# Patient Record
Sex: Male | Born: 1950 | Race: White | Hispanic: No | Marital: Married | Smoking: Never smoker
Health system: Southern US, Community
[De-identification: ages and names within clinical notes are randomized; demographics above are authoritative.]

## PROBLEM LIST (undated history)

## (undated) DIAGNOSIS — F419 Anxiety disorder, unspecified: Secondary | ICD-10-CM

## (undated) DIAGNOSIS — R972 Elevated prostate specific antigen [PSA]: Secondary | ICD-10-CM

## (undated) DIAGNOSIS — Z85828 Personal history of other malignant neoplasm of skin: Secondary | ICD-10-CM

## (undated) DIAGNOSIS — R002 Palpitations: Secondary | ICD-10-CM

## (undated) DIAGNOSIS — G4700 Insomnia, unspecified: Secondary | ICD-10-CM

## (undated) HISTORY — PX: TONSILLECTOMY: SUR1361

## (undated) HISTORY — PX: PROSTATE BIOPSY: SHX241

## (undated) HISTORY — DX: Insomnia, unspecified: G47.00

## (undated) HISTORY — DX: Palpitations: R00.2

## (undated) HISTORY — DX: Anxiety disorder, unspecified: F41.9

## (undated) HISTORY — DX: Elevated prostate specific antigen (PSA): R97.20

## (undated) HISTORY — DX: Personal history of other malignant neoplasm of skin: Z85.828

## (undated) HISTORY — PX: SKIN CANCER EXCISION: SHX779

---

## 1989-02-08 DIAGNOSIS — C4491 Basal cell carcinoma of skin, unspecified: Secondary | ICD-10-CM

## 1989-02-08 HISTORY — DX: Basal cell carcinoma of skin, unspecified: C44.91

## 1996-12-18 DIAGNOSIS — D229 Melanocytic nevi, unspecified: Secondary | ICD-10-CM

## 1996-12-18 HISTORY — DX: Melanocytic nevi, unspecified: D22.9

## 2005-08-10 DIAGNOSIS — C4491 Basal cell carcinoma of skin, unspecified: Secondary | ICD-10-CM

## 2005-08-10 HISTORY — DX: Basal cell carcinoma of skin, unspecified: C44.91

## 2010-02-21 DIAGNOSIS — C4492 Squamous cell carcinoma of skin, unspecified: Secondary | ICD-10-CM

## 2010-02-21 HISTORY — DX: Squamous cell carcinoma of skin, unspecified: C44.92

## 2015-02-04 ENCOUNTER — Other Ambulatory Visit (HOSPITAL_COMMUNITY): Payer: Self-pay | Admitting: Family Medicine

## 2015-02-04 DIAGNOSIS — R002 Palpitations: Secondary | ICD-10-CM

## 2015-02-11 ENCOUNTER — Other Ambulatory Visit: Payer: Self-pay

## 2015-02-11 ENCOUNTER — Ambulatory Visit (HOSPITAL_COMMUNITY): Payer: 59 | Attending: Cardiovascular Disease

## 2015-02-11 ENCOUNTER — Ambulatory Visit (INDEPENDENT_AMBULATORY_CARE_PROVIDER_SITE_OTHER): Payer: 59

## 2015-02-11 DIAGNOSIS — R002 Palpitations: Secondary | ICD-10-CM

## 2015-02-11 DIAGNOSIS — I071 Rheumatic tricuspid insufficiency: Secondary | ICD-10-CM | POA: Diagnosis not present

## 2015-02-11 DIAGNOSIS — I517 Cardiomegaly: Secondary | ICD-10-CM | POA: Diagnosis not present

## 2015-03-19 DIAGNOSIS — Z23 Encounter for immunization: Secondary | ICD-10-CM | POA: Diagnosis not present

## 2015-03-23 DIAGNOSIS — H2513 Age-related nuclear cataract, bilateral: Secondary | ICD-10-CM | POA: Diagnosis not present

## 2015-05-17 DIAGNOSIS — D485 Neoplasm of uncertain behavior of skin: Secondary | ICD-10-CM | POA: Diagnosis not present

## 2015-05-17 DIAGNOSIS — L570 Actinic keratosis: Secondary | ICD-10-CM | POA: Diagnosis not present

## 2015-05-17 DIAGNOSIS — L820 Inflamed seborrheic keratosis: Secondary | ICD-10-CM | POA: Diagnosis not present

## 2015-05-25 DIAGNOSIS — L089 Local infection of the skin and subcutaneous tissue, unspecified: Secondary | ICD-10-CM | POA: Diagnosis not present

## 2015-09-15 DIAGNOSIS — H40053 Ocular hypertension, bilateral: Secondary | ICD-10-CM | POA: Diagnosis not present

## 2015-10-01 DIAGNOSIS — Z136 Encounter for screening for cardiovascular disorders: Secondary | ICD-10-CM | POA: Diagnosis not present

## 2015-10-01 DIAGNOSIS — R002 Palpitations: Secondary | ICD-10-CM | POA: Diagnosis not present

## 2015-10-01 DIAGNOSIS — Z13220 Encounter for screening for lipoid disorders: Secondary | ICD-10-CM | POA: Diagnosis not present

## 2015-10-01 DIAGNOSIS — Z23 Encounter for immunization: Secondary | ICD-10-CM | POA: Diagnosis not present

## 2015-10-01 DIAGNOSIS — Z0000 Encounter for general adult medical examination without abnormal findings: Secondary | ICD-10-CM | POA: Diagnosis not present

## 2015-10-01 DIAGNOSIS — Z125 Encounter for screening for malignant neoplasm of prostate: Secondary | ICD-10-CM | POA: Diagnosis not present

## 2015-10-25 DIAGNOSIS — C4441 Basal cell carcinoma of skin of scalp and neck: Secondary | ICD-10-CM | POA: Diagnosis not present

## 2015-11-16 DIAGNOSIS — L570 Actinic keratosis: Secondary | ICD-10-CM | POA: Diagnosis not present

## 2015-11-16 DIAGNOSIS — D485 Neoplasm of uncertain behavior of skin: Secondary | ICD-10-CM | POA: Diagnosis not present

## 2015-11-16 DIAGNOSIS — L820 Inflamed seborrheic keratosis: Secondary | ICD-10-CM | POA: Diagnosis not present

## 2015-12-01 DIAGNOSIS — R002 Palpitations: Secondary | ICD-10-CM | POA: Diagnosis not present

## 2015-12-06 DIAGNOSIS — Z1211 Encounter for screening for malignant neoplasm of colon: Secondary | ICD-10-CM | POA: Diagnosis not present

## 2015-12-09 DIAGNOSIS — C4441 Basal cell carcinoma of skin of scalp and neck: Secondary | ICD-10-CM | POA: Diagnosis not present

## 2015-12-20 ENCOUNTER — Encounter: Payer: Self-pay | Admitting: Cardiology

## 2015-12-21 DIAGNOSIS — Z4802 Encounter for removal of sutures: Secondary | ICD-10-CM | POA: Diagnosis not present

## 2015-12-31 ENCOUNTER — Encounter: Payer: Self-pay | Admitting: *Deleted

## 2016-01-03 ENCOUNTER — Ambulatory Visit (INDEPENDENT_AMBULATORY_CARE_PROVIDER_SITE_OTHER): Payer: Medicare Other | Admitting: Cardiology

## 2016-01-03 ENCOUNTER — Encounter: Payer: Self-pay | Admitting: Cardiology

## 2016-01-03 VITALS — BP 130/70 | HR 109 | Ht 69.0 in | Wt 167.0 lb

## 2016-01-03 DIAGNOSIS — R0789 Other chest pain: Secondary | ICD-10-CM | POA: Diagnosis not present

## 2016-01-03 DIAGNOSIS — R002 Palpitations: Secondary | ICD-10-CM | POA: Diagnosis not present

## 2016-01-03 DIAGNOSIS — F419 Anxiety disorder, unspecified: Secondary | ICD-10-CM

## 2016-01-03 MED ORDER — METOPROLOL TARTRATE 25 MG PO TABS: 25.0000 mg | ORAL_TABLET | Freq: Two times a day (BID) | ORAL | 3 refills | Status: DC

## 2016-01-03 NOTE — Progress Notes (Signed)
Cardiology Office Note    Date:  01/03/2016   ID:  Chris Chung, DOB 07-17-1950, MRN [id number]  PCP:  [name], MD  Cardiologist:   Ena Dawley, MD   Chief complain: Palpitations, chest pain.  History of Present Illness:  Chris Chung is a 65 y.o. 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 He has a type A personality and has always been extremely busy traveling around the world and exercising for 5 times a week. Since he has retired a year ago and active but also slightly anxious about the current situation and not being so busy, this is an adjustment period for him from his previous busy lifestyle. He has noticed fluttering and skipped beats lasting few seconds approximately 2 months after he retired and they progressed to the point where he would feel them every few minutes and they would last up to a full day. He underwent 4 lab testing by his primary care physician including CMP, CBC TSH and everything was normal. He was given prescription for Xanax and metoprolol when necessary however he hasn't been using it. His palpitations have improved over time however they're persistent they can happen almost on a daily basis however some days they can happen every few minutes, however the only last 1 or 2 seconds at the time. He doesn't have any associated dizziness but they are very on stressful for him. He will feel retrosternal pressure-like pains lasting for a few seconds that is not related to exertion. He is an avid biker and states that rarely he feels palpitations while on a bike he mostly feels them at rest. He denies any lower extremity edema orthopnea or proximal nocturnal dyspnea. He has never smoked. There is no family history of premature coronary artery disease or sudden cardiac death. He underwent echocardiographic in October 2016 that showed normal LVEF only mild upper septal hypertrophy, impaired relaxation with normal  filling pressures and normal size of left atrium. He also underwent 48 hour Holter monitor that showed 5 PVCs in 24 hours, 90 PACs and one a very short run of atrial tachycardia lasting 3-4 seconds. No significant pauses, he's mean heart rate was in 70s to 80s.   Past Medical History:  Diagnosis Date  . Anxiety   . Elevated PSA   . History of skin cancer   . Insomnia   . Palpitations     Past Surgical History:  Procedure Laterality Date  . PROSTATE BIOPSY    . SKIN CANCER EXCISION    . TONSILLECTOMY      Current Medications: Outpatient Medications Prior to Visit  Medication Sig Dispense Refill  . cholecalciferol (VITAMIN D) 1000 units tablet Take 1,000 Units by mouth daily.    . minocycline (MINOCIN,DYNACIN) 50 MG capsule Take 50 mg by mouth as needed (rarely takes , only when face breaks out.).      No facility-administered medications prior to visit.      Allergies:   Review of patient's allergies indicates no known allergies.   Social History   Social History  . Marital status: Married    Spouse name: N/A  . Number of children: 3  . Years of education: N/A   Occupational History  . RETIRED    Social History Main Topics  . Smoking status: Never Smoker  . Smokeless tobacco: Never Used  . Alcohol use No  . Drug use: No  . Sexual activity: Not Asked   Other  Topics Concern  . None   Social History Narrative  . None     Family History:  The patient's Negative for sudden cardiac death or premature coronary artery disease.  ROS:   Please see the history of present illness.    ROS All other systems reviewed and are negative.   PHYSICAL EXAM:   VS:  BP 130/70   Pulse (!) 109   Ht 5\' 9"  (1.753 m)   Wt 167 lb (75.8 kg)   BMI 24.66 kg/m    GEN: Well nourished, well developed, in no acute distress  HEENT: normal  Neck: no JVD, carotid bruits, or masses Cardiac: RRR; no murmurs, rubs, or gallops,no edema  Respiratory:  clear to auscultation bilaterally,  normal work of breathing GI: soft, nontender, nondistended, + BS MS: no deformity or atrophy  Skin: warm and dry, no rash Neuro:  Alert and Oriented x 3, Strength and sensation are intact Psych: euthymic mood, full affect  Wt Readings from Last 3 Encounters:  01/03/16 167 lb (75.8 kg)      Studies/Labs Reviewed:   EKG:  EKG is ordered today.  The ekg ordered today demonstrates Sinus tachycardia otherwise normal EKG.  Recent Labs: No results found for requested labs within last 8760 hours.   Lipid Panel No results found for: CHOL, TRIG, HDL, CHOLHDL, VLDL, LDLCALC, LDLDIRECT  TTE: 02/11/2016 - Left ventricle: The cavity size was normal. There was mild focal   basal hypertrophy of the septum. Systolic function was normal.   The estimated ejection fraction was in the range of 60% to 65%.   Wall motion was normal; there were no regional wall motion   abnormalities. Doppler parameters are consistent with abnormal   left ventricular relaxation (grade 1 diastolic dysfunction). - Mitral valve: Transvalvular velocity was within the normal range.   There was no evidence for stenosis. There was no regurgitation. - Right ventricle: The cavity size was normal. Wall thickness was   normal. Systolic function was normal. - Atrial septum: No defect or patent foramen ovale was identified. - Tricuspid valve: There was trivial regurgitation. - Inferior vena cava: The vessel was normal in size. The   respirophasic diameter changes were in the normal range (= 50%),   consistent with normal central venous pressure.    ASSESSMENT:    1. Other chest pain   2. Heart palpitations   3. Anxiety     PLAN:  In order of problems listed above:  1. Palpitations, I have spent 45 minutes with the patient explaining the character of his PACs and PVCs and there benign nature, advised to eliminate caffeine, hydrate and use of electrolyte drinks especially when prolonged time outside, we will also start  metoprolol 25 mg by mouth twice a day and see if he tolerates it. He underwent 24 hour Holter monitor that only showed PACs and PVCs didn't show long arrhythmias, he's advised to purchase AliveCor Kardia ECG auditory and recorded EKG is when he feels arrhythmias, he will fax or email [place] those results. He doesn't have any structural heart disease and his labs are normal and he is reassured that this is most probably not life-threatening condition and if anything just extra beats or possibly SVT and we will try to document that with his monitor. His left atrium has normal size that decreases the chance of having atrial fibrillation even dominance, in male with long-term moderate to high level of exercise.  There is high level of anxiety secondary to adjustment  to his retirement, he is advised to find relaxation technique possibly try yoga or different classes that he lives might help him to relax.  2. Chest pain somewhat atypical however he has never had one he 65 year old, we'll perform an exercise treadmill stress test to evaluate for ischemia and also for possible arrhythmias.   Medication Adjustments/Labs and Tests Ordered: Current medicines are reviewed at length with the patient today.  Concerns regarding medicines are outlined above.  Medication changes, Labs and Tests ordered today are listed in the Patient Instructions below. Patient Instructions  Medication Instructions:  Star taking Metoprolol 25 mg twice daily. All other medications remain the same.  Labwork: None  Testing/Procedures: Your physician has requested that you have an exercise tolerance test. For further information please visit [email or web address]. Please also follow instruction sheet, as given.  Follow-Up: 6 weeks or next available with Dr [name]     If you need a refill on your cardiac medications before your next appointment, please call your pharmacy.      Signed, Ena Dawley, MD  01/03/2016 1:39 PM      [address] [facility] [place], [place], [place]  [address] Phone: [phone]; Fax: [phone]

## 2016-01-03 NOTE — Patient Instructions (Signed)
**Note De-Identified Lakesa Coste Obfuscation** Medication Instructions:  Star taking Metoprolol 25 mg twice daily. All other medications remain the same.  Labwork: None  Testing/Procedures: Your physician has requested that you have an exercise tolerance test. For further information please visit [email or web address]. Please also follow instruction sheet, as given.  Follow-Up: 6 weeks or next available with Dr [name]     If you need a refill on your cardiac medications before your next appointment, please call your pharmacy.

## 2016-01-09 ENCOUNTER — Encounter: Payer: Self-pay | Admitting: Cardiology

## 2016-01-11 ENCOUNTER — Ambulatory Visit (INDEPENDENT_AMBULATORY_CARE_PROVIDER_SITE_OTHER): Payer: Medicare Other

## 2016-01-11 DIAGNOSIS — R002 Palpitations: Secondary | ICD-10-CM | POA: Diagnosis not present

## 2016-01-11 DIAGNOSIS — R0789 Other chest pain: Secondary | ICD-10-CM

## 2016-01-11 LAB — EXERCISE TOLERANCE TEST
Estimated workload: 13.4 METS
Exercise duration (min): 12 min
Exercise duration (sec): 0 s
MPHR: 153 {beats}/min
Peak HR: 141 {beats}/min
Percent HR: 92 %
RPE: 17
Rest HR: 74 {beats}/min

## 2016-01-13 ENCOUNTER — Telehealth: Payer: Self-pay | Admitting: Cardiology

## 2016-01-13 DIAGNOSIS — R072 Precordial pain: Secondary | ICD-10-CM

## 2016-01-13 DIAGNOSIS — R079 Chest pain, unspecified: Secondary | ICD-10-CM | POA: Insufficient documentation

## 2016-01-13 DIAGNOSIS — R002 Palpitations: Secondary | ICD-10-CM

## 2016-01-13 NOTE — Telephone Encounter (Signed)
New message  ° ° °Pt verbalized that he is returning call for rn °

## 2016-01-13 NOTE — Telephone Encounter (Signed)
Notified the pt that per Dr [name], his GXT showed no evidence of ischemia and good exercise capacity, however his BP decreased during the exercise, and she recommends a coronary cta to further evaluate his coronary anatomy.  Informed the pt that I will place the order in the system and have our [facility] scheduler send for pre-cert and call the pt back to have this arranged over at the hospital.  Pt verbalized understanding and agrees with this plan.

## 2016-01-13 NOTE — Telephone Encounter (Signed)
-----   Message from Dorothy Spark, MD sent at 01/13/2016 11:56 AM EDT ----- No evidence of ischemia and good exercise capacity, however BP decrease during the exercise, I would order a coronary CTA to further evaluate his coronary anatomy.

## 2016-01-17 ENCOUNTER — Encounter: Payer: Self-pay | Admitting: Cardiology

## 2016-01-18 ENCOUNTER — Encounter (INDEPENDENT_AMBULATORY_CARE_PROVIDER_SITE_OTHER): Payer: Self-pay

## 2016-01-18 ENCOUNTER — Other Ambulatory Visit (INDEPENDENT_AMBULATORY_CARE_PROVIDER_SITE_OTHER): Payer: Medicare Other

## 2016-01-18 ENCOUNTER — Telehealth: Payer: Self-pay | Admitting: *Deleted

## 2016-01-18 DIAGNOSIS — R072 Precordial pain: Secondary | ICD-10-CM

## 2016-01-18 DIAGNOSIS — R002 Palpitations: Secondary | ICD-10-CM

## 2016-01-18 NOTE — Telephone Encounter (Signed)
Notes Recorded by Nuala Alpha, LPN on QA348G at [id number] AM EDT Pt scheduled for his pre-lab (bmet) for his image, on 01/18/16.  Pt aware to have this lab done today. ------  Notes Recorded by Nuala Alpha, LPN on QA348G at 624THL AM EDT Coronary cta scheduled for 01/20/16 with bmet prior too.  This was made by p[facility] scheduling and pt is aware of this.

## 2016-01-18 NOTE — Telephone Encounter (Signed)
-----   Message from Armando Gang sent at 01/17/2016  3:48 PM EDT ----- Regarding: RE: coronary cta per [name] /schedule for 01-20-16 @ 9am with nelson/? of labs if so please call patient to schedule before test/saf   ----- Message ----- From: Armando Gang Sent: 01/13/2016   4:47 PM To: Armando Gang, [facility] Pre Cert/Auth Subject: FW: coronary cta per [name]                      ----- Message ----- From: Nuala Alpha, LPN Sent: [id number]   4:27 PM To: Armando Gang, [facility] Pcc Subject: coronary cta per [name]                        Per Dr [name], his GXT showed no evidence of ischemia and good exercise capacity, however his BP decreased during the exercise, and she recommends a coronary cta to further evaluate his coronary anatomy.    CORONARY CT ORDER IS IN THE SYSTEM.  Pt is aware that I will place the order in the system and have our [facility] scheduler send for pre-cert and call the pt back to have this arranged over at the hospital.   Can you please call and arrange?  Thanks buddy,   Karlene Einstein

## 2016-01-18 NOTE — Telephone Encounter (Signed)
Order for bmet is placed for this pt to have done prior to his coronary cta to be done on 01/20/16.  Will send [facility] scheduler Ivin Booty a message to call and arrange pts lab appt today or tomorrow, prior too his scheduled cta on 9/14.

## 2016-01-19 ENCOUNTER — Telehealth: Payer: Self-pay | Admitting: Cardiology

## 2016-01-19 LAB — BASIC METABOLIC PANEL
BUN: 16 mg/dL (ref 7–25)
CO2: 25 mmol/L (ref 20–31)
Calcium: 9.5 mg/dL (ref 8.6–10.3)
Chloride: 104 mmol/L (ref 98–110)
Creat: 1.11 mg/dL (ref 0.70–1.25)
Glucose, Bld: 79 mg/dL (ref 65–99)
Potassium: 4.3 mmol/L (ref 3.5–5.3)
Sodium: 138 mmol/L (ref 135–146)

## 2016-01-19 NOTE — Telephone Encounter (Signed)
Informed the pt that he should hold caffeine 12 hours prior to this test.  Drink plenty of water prior to this test, but hold this 1 hour before his test.  Informed the pt to hold antihistamines 12 hrs prior to this test.  Hold herbal supplements if he takes this.  Informed him to not eat 4 hrs prior to this test.  Informed him to wear loose clothing.  Informed the pt that he can take all his cardiac meds prescribed, for this test.  He does not have to hold any of those meds.   Pt verbalized understanding and agrees with this plan.  Pt gracious for all the assistance provided.    Also informed the pt that I received his copies of his AliveCor EKG tracings for Dr [name] to review.  Informed the pt that she will review this upon return to the office, and will advise if necessary.  Pt verbalized understanding.

## 2016-01-19 NOTE — Telephone Encounter (Signed)
New message    Pt verbalized that he is calling for the rn to find out what medications can he take for the CT appt on  01-20-16 @ 9am appt

## 2016-01-20 ENCOUNTER — Encounter (HOSPITAL_COMMUNITY): Payer: Self-pay

## 2016-01-20 ENCOUNTER — Ambulatory Visit (HOSPITAL_COMMUNITY)
Admission: RE | Admit: 2016-01-20 | Discharge: 2016-01-20 | Disposition: A | Discharge status: Home / Self Care | Payer: Medicare Other | Source: Ambulatory Visit | Attending: Cardiology | Admitting: Cardiology

## 2016-01-20 DIAGNOSIS — R072 Precordial pain: Secondary | ICD-10-CM | POA: Diagnosis not present

## 2016-01-20 DIAGNOSIS — I700 Atherosclerosis of aorta: Secondary | ICD-10-CM | POA: Insufficient documentation

## 2016-01-20 DIAGNOSIS — R918 Other nonspecific abnormal finding of lung field: Secondary | ICD-10-CM | POA: Insufficient documentation

## 2016-01-20 DIAGNOSIS — R002 Palpitations: Secondary | ICD-10-CM | POA: Diagnosis not present

## 2016-01-20 IMAGING — CT CT HEART MORP W/ CTA COR W/ SCORE W/ CA W/CM &/OR W/O CM
1 of 10 series · 1 of 20 positions shown, 2 images · non-contrast
Comparison: None.

CLINICAL DATA: 65-year-old male with chest pain and abnormal
exercise tolerance test.

EXAM:
Cardiac/Coronary  CT
TECHNIQUE: The patient was scanned on a Philips 256 scanner.

[Series 300: locator · axial · 0.35mm/px · z∈[+597,+597]mm · 1 of 1 slices shown, 2 images]
[im 1/1  vessel]
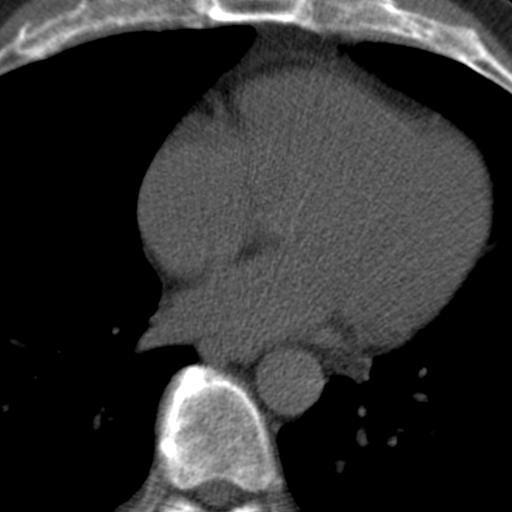
[im 1/1  lung]
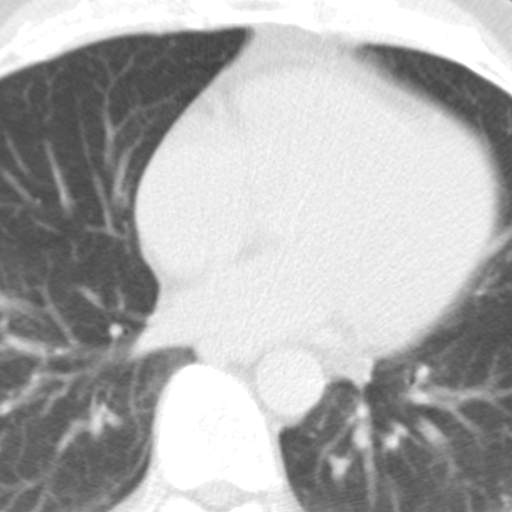

[1 of 20 positions shown; findings below may reference images not displayed]

FINDINGS: A 120 kV prospective scan was triggered in the descending thoracic
aorta at 111 HU's. Axial non-contrast 3 mm slices were carried out
through the heart. The data set was analyzed on a dedicated work
station and scored using the Agatson method. Gantry rotation speed
was 270 msecs and collimation was .9 mm. No beta blockade and 0.4 mg
of sl NTG was given. The 3D data set was reconstructed in 5%
intervals of the 67-82 % of the R-R cycle. Diastolic phases were
analyzed on a dedicated work station using MPR, MIP and VRT modes.
The patient received 80 cc of contrast.

Aorta:  Normal size, no dissection, no calcifications.

Aortic Valve:  Trileaflet, no calcifications.

Coronary Arteries:  Normal coronary origin with right dominance.

RCA is a large dominant vessel that gives rise to PDA and PLVB.
There is diffuse calcified plague in the proximal to mid RCA with
associated stenosis 0-25%.

Left main is a large caliber artery that gives rise to LAD and LCX
arteries. There is distal mild excentric calcified plague with
associated stenosis 0-25% and extending into the ostial LAD.

LAD is a large caliber vessel that gives rise to two small diagonal
branches. There is mild diffuse calcified plague with max stenosis
25-50% in its mid segment.

LCX artery is a medium size non-dominant vessel that gives rise to a
large obtuse marginal branch. There is only minimal calcified plague
with associated stenosis 0-25%.

Other findings:

No ASD/VSD identified.

Normal pulmonary vein drainage into the left atrium.

Normal left atrial appendage with no evidence of thrombus.

Normal size of the pulmonary artery.
IMPRESSION: 1. Coronary calcium score of 52. This was 40 percentile for age and
sex matched control.

2. Normal coronary origin with right dominance.

3. Mild diffuse non-obstructive CAD, an aggressive risk factor
modification is recommended.

Rene Felix Bazan

EXAM:
OVER-READ INTERPRETATION  CT CHEST

The following report is an over-read performed by radiologist Dr.
over-read does not include interpretation of cardiac or coronary
anatomy or pathology. The coronary calcium score/coronary CTA
interpretation by the cardiologist is attached.
FINDINGS: Aortic atherosclerosis. 5 mm pulmonary nodule in the right middle
lobe abutting the minor fissure (image 26 of series 204). A few
other scattered tiny pulmonary nodules measuring 4 mm a last are
also noted in other portions of the lungs, typically subpleural in
location, nonspecific but favored to be benign, likely subpleural
lymph nodes. Within the visualized portions of the thorax there are
no larger more suspicious appearing pulmonary nodules or masses,
there is no acute consolidative airspace disease, no pleural
effusions, no pneumothorax and no lymphadenopathy. There are no
aggressive appearing lytic or blastic lesions noted in the
visualized portions of the skeleton.
IMPRESSION: 1. Several small pulmonary nodules scattered throughout the lungs
bilaterally measuring 5 mm or less in size, highly nonspecific, but
favored to represent benign nodules such as subpleural lymph nodes.
No follow-up needed if patient is low-risk (and has no known or
suspected primary neoplasm). Non-contrast chest CT can be considered
in 12 months if patient is high-risk. This recommendation follows
the consensus statement: Guidelines for Management of Incidental
Pulmonary Nodules Detected on CT Images: From the [HOSPITAL]
2. Aortic atherosclerosis.

## 2016-01-20 MED ORDER — IOPAMIDOL (ISOVUE-370) INJECTION 76%
INTRAVENOUS | Status: AC
  Administered 2016-01-20: 80 mL
  Filled 2016-01-20: qty 100

## 2016-01-20 MED ORDER — METOPROLOL TARTRATE 5 MG/5ML IV SOLN
5.0000 mg | INTRAVENOUS | Status: DC | PRN
  Administered 2016-01-20 (×2): 5 mg via INTRAVENOUS
  Filled 2016-01-20: qty 5

## 2016-01-20 MED ORDER — NITROGLYCERIN 0.4 MG SL SUBL
SUBLINGUAL_TABLET | SUBLINGUAL | Status: AC
  Filled 2016-01-20: qty 2

## 2016-01-20 MED ORDER — METOPROLOL TARTRATE 5 MG/5ML IV SOLN
INTRAVENOUS | Status: AC
  Filled 2016-01-20: qty 10

## 2016-01-20 MED ORDER — NITROGLYCERIN 0.4 MG SL SUBL
0.8000 mg | SUBLINGUAL_TABLET | Freq: Once | SUBLINGUAL | Status: AC
  Administered 2016-01-20: 0.8 mg via SUBLINGUAL
  Filled 2016-01-20: qty 25

## 2016-01-20 NOTE — Progress Notes (Signed)
CT scan completed. Tolerated well. D/C home walking with wife. Awake and alert. In no distress. 

## 2016-01-21 ENCOUNTER — Telehealth: Payer: Self-pay | Admitting: *Deleted

## 2016-01-21 DIAGNOSIS — R9389 Abnormal findings on diagnostic imaging of other specified body structures: Secondary | ICD-10-CM

## 2016-01-21 DIAGNOSIS — E785 Hyperlipidemia, unspecified: Secondary | ICD-10-CM | POA: Insufficient documentation

## 2016-01-21 HISTORY — DX: Abnormal findings on diagnostic imaging of other specified body structures: R93.89

## 2016-01-21 HISTORY — DX: Hyperlipidemia, unspecified: E78.5

## 2016-01-21 NOTE — Telephone Encounter (Signed)
-----   Message from Dorothy Spark, MD sent at 01/21/2016  2:22 PM EDT ----- He doesn't have any significant coronary plague, only minimal with evidence of calcium - calcium score 52, what represents 40th percentile. He doesn't need any coronary catheterization, but I would check his lipid profile as based on some mild plague he might need cholesterol therapy with statins.  Is the Holter still pending?

## 2016-01-21 NOTE — Telephone Encounter (Signed)
Informed the pt he doesn't have any significant coronary plaque, only minimal with evidence of calcium. Informed the pt that her calcium score of 52, which puts him in the 40 th percentile.  Informed the pt that he doesn't need any coronary cath, but she recommends that we check his lipids as based on some mild plaque, and he may need cholesterol therapy with statins.   Scheduled the pt for fasting lipids for next Tuesday 01/25/16 at our office.  Pt is aware to come fasting to this lab appt.  Pt verbalized understanding and agrees with this plan.  Pt gracious for all the assistance provided.   Will also route this message to Dr [name] to make her aware that she didn't order a holter monitor on him, for she advised him to purchase AliveCor and print strips off to bring to the office for her to review and advise on.  Pt dropped this off 2 days ago and they are available for Dr [name] to review on return to the office.

## 2016-01-22 NOTE — Telephone Encounter (Signed)
Thank you :)

## 2016-01-25 ENCOUNTER — Other Ambulatory Visit: Payer: Medicare Other | Admitting: *Deleted

## 2016-01-25 DIAGNOSIS — E785 Hyperlipidemia, unspecified: Secondary | ICD-10-CM | POA: Diagnosis not present

## 2016-01-25 DIAGNOSIS — R938 Abnormal findings on diagnostic imaging of other specified body structures: Secondary | ICD-10-CM | POA: Diagnosis not present

## 2016-01-25 DIAGNOSIS — R9389 Abnormal findings on diagnostic imaging of other specified body structures: Secondary | ICD-10-CM

## 2016-01-25 LAB — LIPID PANEL
Cholesterol: 156 mg/dL (ref 125–200)
HDL: 45 mg/dL (ref >=40)
LDL Cholesterol: 86 mg/dL (ref <130)
Total CHOL/HDL Ratio: 3.5 Ratio (ref <=5.0)
Triglycerides: 127 mg/dL (ref <150)
VLDL: 25 mg/dL (ref <30)

## 2016-01-26 ENCOUNTER — Telehealth: Payer: Self-pay | Admitting: *Deleted

## 2016-01-26 MED ORDER — RED YEAST RICE 600 MG PO CAPS: 600.0000 mg | ORAL_CAPSULE | Freq: Every day | ORAL | Status: DC

## 2016-01-26 NOTE — Telephone Encounter (Signed)
Pt notified by Dr [name] of his lipid results and his coronary cta results.  Dr [name] informed the pt that his cholesterol is excellent, considering he has mild buildup in his coronary arteries, but she recommends that he start over-the-counter red yeast rice 600 mg po daily.  Pt is aware that he can purchase this OTC.  Pt verbalized understanding and agrees with this plan.

## 2016-01-26 NOTE — Telephone Encounter (Signed)
-----   Message from Dorothy Spark, MD sent at 01/26/2016  3:24 PM EDT ----- His cholesterol is excellent, considering he has mild buildup in his coronary arteries I will start over-the-counter red yeast rice 600 mg po daily.

## 2016-01-26 NOTE — Telephone Encounter (Signed)
Dr [name] reviewed this pts AliveCor Strips that were brought to the office by the pt for her to review.  Per Dr [name], she reviewed his recordings and there are few PACs-a benign extra beats that are of no concern.  Per Dr [name], she recommends that he continue to keep recording if any other palpitations occur.  Will endorse this to the pt.

## 2016-01-26 NOTE — Telephone Encounter (Signed)
Pt made aware of Dr Francesca Oman interpretation of his AliveCor strip recordings, via phone.  Pt verbalized understanding.

## 2016-02-14 ENCOUNTER — Telehealth: Payer: Self-pay | Admitting: Cardiology

## 2016-02-14 MED ORDER — METOPROLOL TARTRATE 25 MG PO TABS: 12.5000 mg | ORAL_TABLET | Freq: Two times a day (BID) | ORAL | 1 refills | Status: DC

## 2016-02-14 NOTE — Telephone Encounter (Signed)
Please advise him to cut in half and he continues to have symptoms of orthostatic hypotension then discontinue the medication

## 2016-02-14 NOTE — Telephone Encounter (Signed)
Notified the pt that per Dr [name], she recommends that we 1/2 his dose of metoprolol tartrate to 12.5 mg po BID, and if he continues to have symptoms of orthostatic hypotension with this med, then we will need to consider discontinuing this med and starting him on a different regimen.  Advised the pt monitor his BP and HR and log this information.  Informed the pt that I will call him in a month supply to his confirmed pharmacy of choice, to make sure he tolerates this appropriately.  Advised the pt to come in for his follow-up appt at the beginning of November, and if this med is tolerated, then we will refill this med for a 90 day supply at that time.  Pt verbalized understanding, agrees with this plan, and gracious for all the assistance provided.

## 2016-02-14 NOTE — Telephone Encounter (Signed)
Pt c/o medication issue:  1. Name of Medication: metoprolol  2. How are you currently taking this medication (dosage and times per day)? 25mg  2x day  3. Are you having a reaction (difficulty breathing--STAT)? no  4. What is your medication issue? Dizzy and light headed

## 2016-02-14 NOTE — Telephone Encounter (Signed)
Pt calling to inform Dr [name] that over the course of a week or so, he has been experiencing episodes of dizziness when at rest and when up ambulating. Pt states he is taking his metoprolol tartrated 25 mg po BID.  Pt correlates he issues of dizziness with taking this med. Pt states he can tell its this med, for the episodes occur within an hour or so, after taking this med.  Pt states on the upside of things, his palpitations are very well controlled with this med.  Pt has no BP or HR readings to provide, for he has not been monitoring this. Pt states it somewhat feels like vertigo, but he does not have a history of this issue. Pt states he has no sinus issues or inner ear issues.  Pt has no further cardiac complaints at this time.  Pt states he is staying well hydrated.  Pt suggest maybe for this med to be decreased if possible.  Advised the pt that if he has this issue again, to monitor his BP and HR and log this information to have as a baseline value.  Informed the pt that I will route this information to Dr [name] to review and advise on, and follow-up with the pt shortly thereafter.  Pt verbalized understanding and agrees with this plan.

## 2016-03-08 ENCOUNTER — Ambulatory Visit (INDEPENDENT_AMBULATORY_CARE_PROVIDER_SITE_OTHER): Payer: Medicare Other | Admitting: Cardiology

## 2016-03-08 ENCOUNTER — Encounter: Payer: Self-pay | Admitting: Cardiology

## 2016-03-08 VITALS — BP 126/64 | HR 68 | Ht 69.0 in | Wt 162.0 lb

## 2016-03-08 DIAGNOSIS — I2510 Atherosclerotic heart disease of native coronary artery without angina pectoris: Secondary | ICD-10-CM | POA: Diagnosis not present

## 2016-03-08 DIAGNOSIS — E782 Mixed hyperlipidemia: Secondary | ICD-10-CM

## 2016-03-08 DIAGNOSIS — R002 Palpitations: Secondary | ICD-10-CM | POA: Diagnosis not present

## 2016-03-08 DIAGNOSIS — F419 Anxiety disorder, unspecified: Secondary | ICD-10-CM | POA: Diagnosis not present

## 2016-03-08 MED ORDER — ASPIRIN EC 81 MG PO TBEC: 81.0000 mg | DELAYED_RELEASE_TABLET | Freq: Every day | ORAL | 3 refills | Status: DC

## 2016-03-08 MED ORDER — ROSUVASTATIN CALCIUM 5 MG PO TABS: ORAL_TABLET | ORAL | 11 refills | Status: DC

## 2016-03-08 MED ORDER — ROSUVASTATIN CALCIUM 5 MG PO TABS: 5.0000 mg | ORAL_TABLET | Freq: Every day | ORAL | 11 refills | Status: DC

## 2016-03-08 NOTE — Patient Instructions (Addendum)
**Note De-Identified Imad Shostak Obfuscation** Medication Instructions:  Stop taking Re yeast rice and start taking Rosuvastatin 5 mg three times a week. All other medications remain the same.  Labwork: CMET in 1 month. CMET, Lipids, CBC and TSH in 1 year prior to your next office visit. Please do not eat or drink after midnight the night before labs are drawn.  Testing/Procedures: None  Follow-Up: Your physician wants you to follow-up in: 1 year. You will receive a reminder letter in the mail two months in advance. If you don't receive a letter, please call our office to schedule the follow-up appointment.    If you need a refill on your cardiac medications before your next appointment, please call your pharmacy.

## 2016-03-08 NOTE — Progress Notes (Signed)
Cardiology Office Note    Date:  03/09/2016   ID:  Chris Chung, DOB [birth year], MRN [id number]  PCP:  Chris Amel, MD  Cardiologist:   Chris Dawley, MD   Chief complain: Palpitations, chest pain.  History of Present Illness:  Chris Chung is a 66 y.o. 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 He has a type A personality and has always been extremely busy traveling around the world and exercising for 5 times a week. Since he has retired a year ago and active but also slightly anxious about the current situation and not being so busy, this is an adjustment period for him from his previous busy lifestyle. He has noticed fluttering and skipped beats lasting few seconds approximately 2 months after he retired and they progressed to the point where he would feel them every few minutes and they would last up to a full day. He underwent 4 lab testing by his primary care physician including CMP, CBC TSH and everything was normal. He was given prescription for Xanax and metoprolol when necessary however he hasn't been using it. His palpitations have improved over time however they're persistent they can happen almost on a daily basis however some days they can happen every few minutes, however the only last 1 or 2 seconds at the time. He doesn't have any associated dizziness but they are very on stressful for him. He will feel retrosternal pressure-like pains lasting for a few seconds that is not related to exertion. He is an avid biker and states that rarely he feels palpitations while on a bike he mostly feels them at rest. He denies any lower extremity edema orthopnea or proximal nocturnal dyspnea. He has never smoked. There is no family history of premature coronary artery disease or sudden cardiac death. He underwent echocardiographic in October 2016 that showed normal LVEF only mild upper septal hypertrophy, impaired relaxation with normal  filling pressures and normal size of left atrium. He also underwent 48 hour Holter monitor that showed 5 PVCs in 24 hours, 90 PACs and one a very short run of atrial tachycardia lasting 3-4 seconds. No significant pauses, he's mean heart rate was in 70s to 80s.  03/08/2016 the patient is coming fr follow up, he feels great, less palpitations and anxiety. No chest pain, he is able to do all sports activities without any limitations. No syncope. His coronary CTA showed mild at most 25% stenosis, calcium score 52.   Past Medical History:  Diagnosis Date  . Anxiety   . Elevated PSA   . History of skin cancer   . Insomnia   . Palpitations     Past Surgical History:  Procedure Laterality Date  . PROSTATE BIOPSY    . SKIN CANCER EXCISION    . TONSILLECTOMY      Current Medications: Outpatient Medications Prior to Visit  Medication Sig Dispense Refill  . ALPRAZolam (XANAX) 0.25 MG tablet Take 1 tablet by mouth daily as needed.    . cholecalciferol (VITAMIN D) 1000 units tablet Take 1,000 Units by mouth daily.    . metoprolol tartrate (LOPRESSOR) 25 MG tablet Take 0.5 tablets (12.5 mg total) by mouth 2 (two) times daily. 60 tablet 1  . minocycline (MINOCIN,DYNACIN) 50 MG capsule Take 50 mg by mouth as needed (rarely takes , only when face breaks out.).     Marland Kitchen Red Yeast Rice 600 MG CAPS Take 1 capsule (600 mg total) by mouth  daily.     No facility-administered medications prior to visit.      Allergies:   Review of patient's allergies indicates no known allergies.   Social History   Social History  . Marital status: Married    Spouse name: N/A  . Number of children: 3  . Years of education: N/A   Occupational History  . RETIRED    Social History Main Topics  . Smoking status: Never Smoker  . Smokeless tobacco: Never Used  . Alcohol use No  . Drug use: No  . Sexual activity: Not on file   Other Topics Concern  . Not on file   Social History Narrative  . No narrative on  file     Family History:  The patient's Negative for sudden cardiac death or premature coronary artery disease.  ROS:   Please see the history of present illness.    ROS All other systems reviewed and are negative.   PHYSICAL EXAM:   VS:  BP 126/64 (BP Location: Left Arm, Patient Position: Sitting, Cuff Size: Normal)   Pulse 68   Ht 5\' 9"  (1.753 m)   Wt 162 lb (73.5 kg)   BMI 23.92 kg/m    GEN: Well nourished, well developed, in no acute distress  HEENT: normal  Neck: no JVD, carotid bruits, or masses Cardiac: RRR; no murmurs, rubs, or gallops,no edema  Respiratory:  clear to auscultation bilaterally, normal work of breathing GI: soft, nontender, nondistended, + BS MS: no deformity or atrophy  Skin: warm and dry, no rash Neuro:  Alert and Oriented x 3, Strength and sensation are intact Psych: euthymic mood, full affect  Wt Readings from Last 3 Encounters:  03/08/16 162 lb (73.5 kg)  01/03/16 167 lb (75.8 kg)      Studies/Labs Reviewed:   EKG:  EKG is ordered today.  The ekg ordered today demonstrates Sinus tachycardia otherwise normal EKG.  Recent Labs: 01/18/2016: BUN 16; Creat 1.11; Potassium 4.3; Sodium 138   Lipid Panel    Component Value Date/Time   CHOL 156 01/25/2016 0756   TRIG 127 01/25/2016 0756   HDL 45 01/25/2016 0756   CHOLHDL 3.5 01/25/2016 0756   VLDL 25 01/25/2016 0756   LDLCALC 86 01/25/2016 0756    TTE: 02/11/2016 - Left ventricle: The cavity size was normal. There was mild focal   basal hypertrophy of the septum. Systolic function was normal.   The estimated ejection fraction was in the range of 60% to 65%.   Wall motion was normal; there were no regional wall motion   abnormalities. Doppler parameters are consistent with abnormal   left ventricular relaxation (grade 1 diastolic dysfunction). - Mitral valve: Transvalvular velocity was within the normal range.   There was no evidence for stenosis. There was no regurgitation. - Right  ventricle: The cavity size was normal. Wall thickness was   normal. Systolic function was normal. - Atrial septum: No defect or patent foramen ovale was identified. - Tricuspid valve: There was trivial regurgitation. - Inferior vena cava: The vessel was normal in size. The   respirophasic diameter changes were in the normal range (= 50%),   consistent with normal central venous pressure.    ASSESSMENT:    1. Heart palpitations   2. Mixed hyperlipidemia   3. Coronary artery disease involving native coronary artery of native heart without angina pectoris   4. Anxiety     PLAN:  In order of problems listed above:  1. Palpitations,  24 hour Holter monitor that only showed PACs and PVCs didn't show long arrhythmias, he's advised to purchase [employer] Kardia ECG auditory and recorded EKG is when he feels arrhythmias, he will fax or email [place] those results. He feels better since we started metoprolol.  2. Chest pain somewhat atypical - coronary CTA showed ca score of 52 and mild plaque, we will start statin  3. Hyperlipidemia - d/c red yeast rice, start Crestor 5 mg po 3x/week, repeat LFTs in 1 month.   Medication Adjustments/Labs and Tests Ordered: Current medicines are reviewed at length with the patient today.  Concerns regarding medicines are outlined above.  Medication changes, Labs and Tests ordered today are listed in the Patient Instructions below. Patient Instructions  Medication Instructions:  Stop taking Re yeast rice and start taking Rosuvastatin 5 mg three times a week. All other medications remain the same.  Labwork: CMET in 1 month. CMET, Lipids, CBC and TSH in 1 year prior to your next office visit. Please do not eat or drink after midnight the night before labs are drawn.  Testing/Procedures: None  Follow-Up: Your physician wants you to follow-up in: 1 year. You will receive a reminder letter in the mail two months in advance. If you don't receive a letter, please  call our office to schedule the follow-up appointment.    If you need a refill on your cardiac medications before your next appointment, please call your pharmacy.      Signed, Chris Dawley, MD  03/09/2016 6:45 AM    [address], [place], [place]  [address] Phone: [phone]; Fax: [phone]

## 2016-03-17 ENCOUNTER — Telehealth: Payer: Self-pay | Admitting: *Deleted

## 2016-03-17 NOTE — Telephone Encounter (Signed)
Pt dropped off some Kardia readings from his apple phone that he recorded for Dr [name] to review.  Informed the pt that Dr [name] reviewed these tracings and wanted me to endorse to the pt that both were completely normal and he remained in NSR.  Endorsed to the pt that Dr [name] wanted me to reassure him that his tracings are completely normal.  Advised the pt to continue recording and call our office with any reoccurring symptoms or any other cardiac issues.  Pt verbalized understanding and agrees with this plan.

## 2016-04-05 DIAGNOSIS — F419 Anxiety disorder, unspecified: Secondary | ICD-10-CM | POA: Diagnosis not present

## 2016-04-05 DIAGNOSIS — Z23 Encounter for immunization: Secondary | ICD-10-CM | POA: Diagnosis not present

## 2016-04-05 DIAGNOSIS — R42 Dizziness and giddiness: Secondary | ICD-10-CM | POA: Diagnosis not present

## 2016-04-07 ENCOUNTER — Other Ambulatory Visit: Payer: Medicare Other | Admitting: *Deleted

## 2016-04-07 DIAGNOSIS — E7800 Pure hypercholesterolemia, unspecified: Secondary | ICD-10-CM

## 2016-04-07 DIAGNOSIS — I200 Unstable angina: Secondary | ICD-10-CM

## 2016-04-07 DIAGNOSIS — R002 Palpitations: Secondary | ICD-10-CM

## 2016-04-07 LAB — COMPREHENSIVE METABOLIC PANEL
ALT: 68 U/L — ABNORMAL HIGH (ref 9–46)
AST: 51 U/L — ABNORMAL HIGH (ref 10–35)
Albumin: 4.2 g/dL (ref 3.6–5.1)
Alkaline Phosphatase: 140 U/L — ABNORMAL HIGH (ref 40–115)
BUN: 20 mg/dL (ref 7–25)
CO2: 24 mmol/L (ref 20–31)
Calcium: 9 mg/dL (ref 8.6–10.3)
Chloride: 106 mmol/L (ref 98–110)
Creat: 1 mg/dL (ref 0.70–1.25)
Glucose, Bld: 100 mg/dL — ABNORMAL HIGH (ref 65–99)
Potassium: 4 mmol/L (ref 3.5–5.3)
Sodium: 140 mmol/L (ref 135–146)
Total Bilirubin: 0.9 mg/dL (ref 0.2–1.2)
Total Protein: 6.2 g/dL (ref 6.1–8.1)

## 2016-04-11 ENCOUNTER — Other Ambulatory Visit: Payer: Self-pay | Admitting: Cardiology

## 2016-04-11 DIAGNOSIS — E7800 Pure hypercholesterolemia, unspecified: Secondary | ICD-10-CM

## 2016-05-10 DIAGNOSIS — H40053 Ocular hypertension, bilateral: Secondary | ICD-10-CM | POA: Diagnosis not present

## 2016-05-16 ENCOUNTER — Other Ambulatory Visit: Payer: Medicare Other | Admitting: *Deleted

## 2016-05-16 DIAGNOSIS — E7800 Pure hypercholesterolemia, unspecified: Secondary | ICD-10-CM

## 2016-05-16 NOTE — Addendum Note (Signed)
Addended by: Eulis Foster on: 05/16/2016 10:10 AM   Modules accepted: Orders

## 2016-05-16 NOTE — Addendum Note (Signed)
Addended by: Eulis Foster on: 05/16/2016 10:11 AM   Modules accepted: Orders

## 2016-05-17 ENCOUNTER — Telehealth: Payer: Self-pay | Admitting: *Deleted

## 2016-05-17 DIAGNOSIS — E7849 Other hyperlipidemia: Secondary | ICD-10-CM

## 2016-05-17 LAB — COMPREHENSIVE METABOLIC PANEL
ALT: 68 IU/L — ABNORMAL HIGH (ref 0–44)
AST: 38 IU/L (ref 0–40)
Albumin/Globulin Ratio: 2.2 (ref 1.2–2.2)
Albumin: 4.4 g/dL (ref 3.6–4.8)
Alkaline Phosphatase: 138 IU/L — ABNORMAL HIGH (ref 39–117)
BUN/Creatinine Ratio: 12 (ref 10–24)
BUN: 13 mg/dL (ref 8–27)
Bilirubin Total: 0.7 mg/dL (ref 0.0–1.2)
CO2: 21 mmol/L (ref 18–29)
Calcium: 9.1 mg/dL (ref 8.6–10.2)
Chloride: 103 mmol/L (ref 96–106)
Creatinine, Ser: 1.13 mg/dL (ref 0.76–1.27)
GFR calc Af Amer: 77 mL/min/{1.73_m2} (ref >59)
GFR calc non Af Amer: 67 mL/min/{1.73_m2} (ref >59)
Globulin, Total: 2 g/dL (ref 1.5–4.5)
Glucose: 92 mg/dL (ref 65–99)
Potassium: 4.4 mmol/L (ref 3.5–5.2)
Sodium: 141 mmol/L (ref 134–144)
Total Protein: 6.4 g/dL (ref 6.0–8.5)

## 2016-05-17 NOTE — Telephone Encounter (Signed)
Notified the pt that per Dr [name], his AST has improved, ALT stable and acceptable.  Informed the pt that per Dr [name], she recommends that we repeat his CMET and Lipids in 2 months, and if ok at that time, then we will just repeat this once a year. Informed the pt that per Dr [name], he should continue on his low dose Crestor at 5 mg po only 3 times per week.  Also advised the pt that per Dr [name], he needs to make sure he's limiting his alcohol intake and make sure he avoids taking high dose of Tylenol.  Scheduled the pt a lab appt to repeat cmet and lipids in 2 months on 07/17/16.  Advised the pt to come fasting to this lab appt. Pt verbalized understanding and agrees with this plan.

## 2016-05-17 NOTE — Telephone Encounter (Signed)
-----   Message from Dorothy Spark, MD sent at 05/17/2016  4:01 PM EST ----- AST has improved, ALT stable and acceptable. I would repeat CMP and lipids in 2 months, if ok at that time, repeat just once a year. Make sure he is not taking high dose of tylenol. [place] Siren

## 2016-06-13 DIAGNOSIS — D492 Neoplasm of unspecified behavior of bone, soft tissue, and skin: Secondary | ICD-10-CM | POA: Diagnosis not present

## 2016-06-13 DIAGNOSIS — L821 Other seborrheic keratosis: Secondary | ICD-10-CM | POA: Diagnosis not present

## 2016-06-13 DIAGNOSIS — L570 Actinic keratosis: Secondary | ICD-10-CM | POA: Diagnosis not present

## 2016-07-04 DIAGNOSIS — H9313 Tinnitus, bilateral: Secondary | ICD-10-CM

## 2016-07-04 DIAGNOSIS — H903 Sensorineural hearing loss, bilateral: Secondary | ICD-10-CM | POA: Insufficient documentation

## 2016-07-04 DIAGNOSIS — R42 Dizziness and giddiness: Secondary | ICD-10-CM | POA: Insufficient documentation

## 2016-07-04 HISTORY — DX: Dizziness and giddiness: R42

## 2016-07-04 HISTORY — DX: Tinnitus, bilateral: H93.13

## 2016-07-09 ENCOUNTER — Other Ambulatory Visit: Payer: Self-pay | Admitting: Cardiology

## 2016-07-17 ENCOUNTER — Other Ambulatory Visit: Payer: Medicare Other | Admitting: *Deleted

## 2016-07-17 DIAGNOSIS — E7849 Other hyperlipidemia: Secondary | ICD-10-CM

## 2016-07-17 DIAGNOSIS — E784 Other hyperlipidemia: Secondary | ICD-10-CM | POA: Diagnosis not present

## 2016-07-17 LAB — COMPREHENSIVE METABOLIC PANEL
ALT: 46 IU/L — ABNORMAL HIGH (ref 0–44)
AST: 35 IU/L (ref 0–40)
Albumin/Globulin Ratio: 2 (ref 1.2–2.2)
Albumin: 4.3 g/dL (ref 3.6–4.8)
Alkaline Phosphatase: 141 IU/L — ABNORMAL HIGH (ref 39–117)
BUN/Creatinine Ratio: 14 (ref 10–24)
BUN: 16 mg/dL (ref 8–27)
Bilirubin Total: 0.8 mg/dL (ref 0.0–1.2)
CO2: 21 mmol/L (ref 18–29)
Calcium: 9.1 mg/dL (ref 8.6–10.2)
Chloride: 102 mmol/L (ref 96–106)
Creatinine, Ser: 1.18 mg/dL (ref 0.76–1.27)
GFR calc Af Amer: 73 mL/min/{1.73_m2} (ref >59)
GFR calc non Af Amer: 63 mL/min/{1.73_m2} (ref >59)
Globulin, Total: 2.1 g/dL (ref 1.5–4.5)
Glucose: 88 mg/dL (ref 65–99)
Potassium: 4.2 mmol/L (ref 3.5–5.2)
Sodium: 140 mmol/L (ref 134–144)
Total Protein: 6.4 g/dL (ref 6.0–8.5)

## 2016-07-17 LAB — LIPID PANEL
Chol/HDL Ratio: 2.7 ratio units (ref 0.0–5.0)
Cholesterol, Total: 151 mg/dL (ref 100–199)
HDL: 55 mg/dL (ref >39)
LDL Calculated: 73 mg/dL (ref 0–99)
Triglycerides: 113 mg/dL (ref 0–149)
VLDL Cholesterol Cal: 23 mg/dL (ref 5–40)

## 2016-07-31 DIAGNOSIS — Z85828 Personal history of other malignant neoplasm of skin: Secondary | ICD-10-CM | POA: Diagnosis not present

## 2016-07-31 DIAGNOSIS — D492 Neoplasm of unspecified behavior of bone, soft tissue, and skin: Secondary | ICD-10-CM | POA: Diagnosis not present

## 2016-07-31 DIAGNOSIS — B009 Herpesviral infection, unspecified: Secondary | ICD-10-CM | POA: Diagnosis not present

## 2016-07-31 DIAGNOSIS — L570 Actinic keratosis: Secondary | ICD-10-CM | POA: Diagnosis not present

## 2016-10-03 DIAGNOSIS — Z79899 Other long term (current) drug therapy: Secondary | ICD-10-CM | POA: Diagnosis not present

## 2016-10-03 DIAGNOSIS — Z0000 Encounter for general adult medical examination without abnormal findings: Secondary | ICD-10-CM | POA: Diagnosis not present

## 2016-10-03 DIAGNOSIS — Z125 Encounter for screening for malignant neoplasm of prostate: Secondary | ICD-10-CM | POA: Diagnosis not present

## 2016-10-03 DIAGNOSIS — E7800 Pure hypercholesterolemia, unspecified: Secondary | ICD-10-CM | POA: Diagnosis not present

## 2016-10-03 DIAGNOSIS — F419 Anxiety disorder, unspecified: Secondary | ICD-10-CM | POA: Diagnosis not present

## 2016-10-03 DIAGNOSIS — I10 Essential (primary) hypertension: Secondary | ICD-10-CM | POA: Diagnosis not present

## 2016-10-09 DIAGNOSIS — B009 Herpesviral infection, unspecified: Secondary | ICD-10-CM | POA: Diagnosis not present

## 2016-10-09 DIAGNOSIS — L923 Foreign body granuloma of the skin and subcutaneous tissue: Secondary | ICD-10-CM | POA: Diagnosis not present

## 2016-10-09 DIAGNOSIS — L570 Actinic keratosis: Secondary | ICD-10-CM | POA: Diagnosis not present

## 2017-03-01 DIAGNOSIS — Z23 Encounter for immunization: Secondary | ICD-10-CM | POA: Diagnosis not present

## 2017-03-25 ENCOUNTER — Other Ambulatory Visit: Payer: Self-pay | Admitting: Cardiology

## 2017-04-09 ENCOUNTER — Encounter: Payer: Self-pay | Admitting: Cardiology

## 2017-04-17 DIAGNOSIS — L820 Inflamed seborrheic keratosis: Secondary | ICD-10-CM | POA: Diagnosis not present

## 2017-04-17 DIAGNOSIS — L570 Actinic keratosis: Secondary | ICD-10-CM | POA: Diagnosis not present

## 2017-04-17 DIAGNOSIS — D492 Neoplasm of unspecified behavior of bone, soft tissue, and skin: Secondary | ICD-10-CM | POA: Diagnosis not present

## 2017-04-23 ENCOUNTER — Ambulatory Visit (INDEPENDENT_AMBULATORY_CARE_PROVIDER_SITE_OTHER): Payer: Medicare Other | Admitting: Cardiology

## 2017-04-23 ENCOUNTER — Encounter: Payer: Self-pay | Admitting: Cardiology

## 2017-04-23 VITALS — BP 124/64 | HR 84 | Ht 69.0 in | Wt 170.0 lb

## 2017-04-23 DIAGNOSIS — I2510 Atherosclerotic heart disease of native coronary artery without angina pectoris: Secondary | ICD-10-CM | POA: Diagnosis not present

## 2017-04-23 DIAGNOSIS — I2584 Coronary atherosclerosis due to calcified coronary lesion: Secondary | ICD-10-CM | POA: Diagnosis not present

## 2017-04-23 DIAGNOSIS — E7849 Other hyperlipidemia: Secondary | ICD-10-CM | POA: Diagnosis not present

## 2017-04-23 DIAGNOSIS — F419 Anxiety disorder, unspecified: Secondary | ICD-10-CM | POA: Insufficient documentation

## 2017-04-23 DIAGNOSIS — I493 Ventricular premature depolarization: Secondary | ICD-10-CM | POA: Diagnosis not present

## 2017-04-23 NOTE — Patient Instructions (Addendum)

## 2017-04-23 NOTE — Progress Notes (Signed)
Cardiology Office Note    Date:  04/23/2017   ID:  [name], DOB 1950-09-24, MRN [id number]  PCP:  [name], MD  Cardiologist:   Ena Dawley, MD   Chief complain: Palpitations, chest pain.  History of Present Illness:  Chris Chung is a 66 y.o. 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 He has a type A personality and has always been extremely busy traveling around the world and exercising for 5 times a week. Since he has retired a year ago and active but also slightly anxious about the current situation and not being so busy, this is an adjustment period for him from his previous busy lifestyle. He has noticed fluttering and skipped beats lasting few seconds approximately 2 months after he retired and they progressed to the point where he would feel them every few minutes and they would last up to a full day. He underwent 4 lab testing by his primary care physician including CMP, CBC TSH and everything was normal. He was given prescription for Xanax and metoprolol when necessary however he hasn't been using it. His palpitations have improved over time however they're persistent they can happen almost on a daily basis however some days they can happen every few minutes, however the only last 1 or 2 seconds at the time. He doesn't have any associated dizziness but they are very on stressful for him. He will feel retrosternal pressure-like pains lasting for a few seconds that is not related to exertion. He is an avid biker and states that rarely he feels palpitations while on a bike he mostly feels them at rest. He denies any lower extremity edema orthopnea or proximal nocturnal dyspnea. He has never smoked. There is no family history of premature coronary artery disease or sudden cardiac death. He underwent echocardiographic in October 2016 that showed normal LVEF only mild upper septal hypertrophy, impaired relaxation with normal  filling pressures and normal size of left atrium. He also underwent 48 hour Holter monitor that showed 5 PVCs in 24 hours, 90 PACs and one a very short run of atrial tachycardia lasting 3-4 seconds. No significant pauses, he's mean heart rate was in 70s to 80s.  03/08/2016 the patient is coming fr follow up, he feels great, less palpitations and anxiety. No chest pain, he is able to do all sports activities without any limitations. No syncope. His coronary CTA showed mild at most 25% stenosis, calcium score 52.  04/23/2017, this is one year follow-up, the patient continues to exercise almost every day, and has no symptoms of chest pain or shortness of breath. He feels occasional palpitations rarely lasting for several hours but the are just more frequent PVCs not sustained tachycardias. He denies any dizziness chest pain falls or syncope with it. He is tolerating rosuvastatin and denies any myalgias.   Past Medical History:  Diagnosis Date  . Anxiety   . Elevated PSA   . History of skin cancer   . Insomnia   . Palpitations     Past Surgical History:  Procedure Laterality Date  . PROSTATE BIOPSY    . SKIN CANCER EXCISION    . TONSILLECTOMY      Current Medications: Outpatient Medications Prior to Visit  Medication Sig Dispense Refill  . cholecalciferol (VITAMIN D) 1000 units tablet Take 1,000 Units by mouth daily.    . metoprolol tartrate (LOPRESSOR) 25 MG tablet take 1/2 tablet by mouth twice a day  60 tablet 7  . minocycline (MINOCIN,DYNACIN) 50 MG capsule Take 50 mg by mouth as needed (rarely takes , only when face breaks out.).     Marland Kitchen rosuvastatin (CRESTOR) 5 MG tablet TAKE 1 TABLET BY MOUTH THREE TIMES A WEEK. Please keep upcoming appt in December for future refills. Thanks 15 tablet 1  . ALPRAZolam (XANAX) 0.25 MG tablet Take 1 tablet by mouth daily as needed.    Marland Kitchen aspirin EC 81 MG tablet Take 1 tablet (81 mg total) by mouth daily. (Patient not taking: Reported on 04/23/2017) 90  tablet 3   No facility-administered medications prior to visit.      Allergies:   Patient has no known allergies.   Social History   Socioeconomic History  . Marital status: Married    Spouse name: None  . Number of children: 3  . Years of education: None  . Highest education level: None  Social Needs  . Financial resource strain: None  . Food insecurity - worry: None  . Food insecurity - inability: None  . Transportation needs - medical: None  . Transportation needs - non-medical: None  Occupational History  . Occupation: RETIRED  Tobacco Use  . Smoking status: Never Smoker  . Smokeless tobacco: Never Used  Substance and Sexual Activity  . Alcohol use: No  . Drug use: No  . Sexual activity: None  Other Topics Concern  . None  Social History Narrative  . None     Family History:  The patient's Negative for sudden cardiac death or premature coronary artery disease.  ROS:   Please see the history of present illness.    ROS All other systems reviewed and are negative.   PHYSICAL EXAM:   VS:  BP 124/64   Pulse 84   Ht 5\' 9"  (1.753 m)   Wt 170 lb (77.1 kg)   BMI 25.10 kg/m    GEN: Well nourished, well developed, in no acute distress  HEENT: normal  Neck: no JVD, carotid bruits, or masses Cardiac: RRR; no murmurs, rubs, or gallops,no edema  Respiratory:  clear to auscultation bilaterally, normal work of breathing GI: soft, nontender, nondistended, + BS MS: no deformity or atrophy  Skin: warm and dry, no rash Neuro:  Alert and Oriented x 3, Strength and sensation are intact Psych: euthymic mood, full affect  Wt Readings from Last 3 Encounters:  04/23/17 170 lb (77.1 kg)  03/08/16 162 lb (73.5 kg)  01/03/16 167 lb (75.8 kg)    Studies/Labs Reviewed:   EKG:  EKG is ordered today.  The ekg ordered today demonstrates sinus rhythm with ventricular rate 84 bpm is completely normal EKG, when compared to prior heart rate is slower, this was personally reviewed.    Recent Labs: 07/17/2016: ALT 46; BUN 16; Creatinine, Ser 1.18; Potassium 4.2; Sodium 140   Lipid Panel    Component Value Date/Time   CHOL 151 07/17/2016 0819   TRIG 113 07/17/2016 0819   HDL 55 07/17/2016 0819   CHOLHDL 2.7 07/17/2016 0819   CHOLHDL 3.5 01/25/2016 0756   VLDL 25 01/25/2016 0756   LDLCALC 73 07/17/2016 0819    TTE: 02/11/2016 - Left ventricle: The cavity size was normal. There was mild focal   basal hypertrophy of the septum. Systolic function was normal.   The estimated ejection fraction was in the range of 60% to 65%.   Wall motion was normal; there were no regional wall motion   abnormalities. Doppler parameters are consistent with  abnormal   left ventricular relaxation (grade 1 diastolic dysfunction). - Mitral valve: Transvalvular velocity was within the normal range.   There was no evidence for stenosis. There was no regurgitation. - Right ventricle: The cavity size was normal. Wall thickness was   normal. Systolic function was normal. - Atrial septum: No defect or patent foramen ovale was identified. - Tricuspid valve: There was trivial regurgitation. - Inferior vena cava: The vessel was normal in size. The   respirophasic diameter changes were in the normal range (= 50%),   consistent with normal central venous pressure.    ASSESSMENT:    1. Other hyperlipidemia   2. Coronary artery disease involving native coronary artery of native heart without angina pectoris   3. PVC (premature ventricular contraction)   4. Coronary artery calcification     PLAN:  In order of problems listed above:  1. Palpitations,  24 hour Holter monitor that only showed PACs and PVCs didn't show long arrhythmias, this has improved significantly since starting low-dose of metoprolol, his also told that he can take an extra metoprolol when he has episodes of more frequent PVCs.  2. Chest pain somewhat atypical - coronary CTA showed ca score of 52 and mild plaque, we  started rosuvastatin that he's tolerating well he has all lipids at goal. I would continue the same management. We would repeat calcium score for prognostic reasons in 2 years.  3. Hyperlipidemia - started rosuvastatin, normal LFTs, continue the same management.   Medication Adjustments/Labs and Tests Ordered: Current medicines are reviewed at length with the patient today.  Concerns regarding medicines are outlined above.  Medication changes, Labs and Tests ordered today are listed in the Patient Instructions below. Patient Instructions  Medication Instructions:   Your physician recommends that you continue on your current medications as directed. Please refer to the Current Medication list given to you today.    Follow-Up:  Your physician wants you to follow-up in: [place] will receive a reminder letter in the mail two months in advance. If you don't receive a letter, please call our office to schedule the follow-up appointment.        If you need a refill on your cardiac medications before your next appointment, please call your pharmacy.      Signed, Ena Dawley, MD  04/23/2017 4:07 PM    [facility] [place], [place],   [address] Phone: [phone]; Fax: [phone]

## 2017-05-24 ENCOUNTER — Other Ambulatory Visit: Payer: Self-pay | Admitting: Cardiology

## 2017-05-29 DIAGNOSIS — H2513 Age-related nuclear cataract, bilateral: Secondary | ICD-10-CM | POA: Diagnosis not present

## 2017-08-14 ENCOUNTER — Other Ambulatory Visit: Payer: Self-pay | Admitting: Cardiology

## 2017-10-15 DIAGNOSIS — L309 Dermatitis, unspecified: Secondary | ICD-10-CM | POA: Diagnosis not present

## 2017-12-20 DIAGNOSIS — L080 Pyoderma: Secondary | ICD-10-CM | POA: Diagnosis not present

## 2017-12-20 DIAGNOSIS — L089 Local infection of the skin and subcutaneous tissue, unspecified: Secondary | ICD-10-CM | POA: Diagnosis not present

## 2017-12-20 DIAGNOSIS — B009 Herpesviral infection, unspecified: Secondary | ICD-10-CM | POA: Diagnosis not present

## 2018-02-05 ENCOUNTER — Encounter: Payer: Self-pay | Admitting: Physician Assistant

## 2018-02-12 ENCOUNTER — Other Ambulatory Visit: Payer: Self-pay | Admitting: Cardiology

## 2018-02-12 DIAGNOSIS — Z23 Encounter for immunization: Secondary | ICD-10-CM | POA: Diagnosis not present

## 2018-03-22 DIAGNOSIS — I493 Ventricular premature depolarization: Secondary | ICD-10-CM | POA: Diagnosis not present

## 2018-03-22 DIAGNOSIS — Z79899 Other long term (current) drug therapy: Secondary | ICD-10-CM | POA: Diagnosis not present

## 2018-03-22 DIAGNOSIS — E7800 Pure hypercholesterolemia, unspecified: Secondary | ICD-10-CM | POA: Diagnosis not present

## 2018-03-22 DIAGNOSIS — I10 Essential (primary) hypertension: Secondary | ICD-10-CM | POA: Diagnosis not present

## 2018-03-22 DIAGNOSIS — Z0001 Encounter for general adult medical examination with abnormal findings: Secondary | ICD-10-CM | POA: Diagnosis not present

## 2018-03-22 DIAGNOSIS — Z23 Encounter for immunization: Secondary | ICD-10-CM | POA: Diagnosis not present

## 2018-04-01 DIAGNOSIS — R740 Nonspecific elevation of levels of transaminase and lactic acid dehydrogenase [LDH]: Secondary | ICD-10-CM | POA: Diagnosis not present

## 2018-04-02 DIAGNOSIS — L570 Actinic keratosis: Secondary | ICD-10-CM | POA: Diagnosis not present

## 2018-04-02 DIAGNOSIS — B009 Herpesviral infection, unspecified: Secondary | ICD-10-CM | POA: Diagnosis not present

## 2018-04-02 DIAGNOSIS — D229 Melanocytic nevi, unspecified: Secondary | ICD-10-CM | POA: Diagnosis not present

## 2018-04-11 DIAGNOSIS — H40013 Open angle with borderline findings, low risk, bilateral: Secondary | ICD-10-CM | POA: Diagnosis not present

## 2018-04-15 ENCOUNTER — Ambulatory Visit: Payer: Medicare Other | Admitting: Physician Assistant

## 2018-05-15 ENCOUNTER — Ambulatory Visit (INDEPENDENT_AMBULATORY_CARE_PROVIDER_SITE_OTHER): Payer: Medicare Other | Admitting: Cardiology

## 2018-05-15 ENCOUNTER — Encounter: Payer: Self-pay | Admitting: Cardiology

## 2018-05-15 VITALS — BP 110/80 | HR 76 | Ht 69.0 in | Wt 172.8 lb

## 2018-05-15 DIAGNOSIS — I2510 Atherosclerotic heart disease of native coronary artery without angina pectoris: Secondary | ICD-10-CM | POA: Diagnosis not present

## 2018-05-15 DIAGNOSIS — E7849 Other hyperlipidemia: Secondary | ICD-10-CM | POA: Diagnosis not present

## 2018-05-15 DIAGNOSIS — I493 Ventricular premature depolarization: Secondary | ICD-10-CM

## 2018-05-15 NOTE — Progress Notes (Signed)
.  ed

## 2018-05-15 NOTE — Patient Instructions (Signed)
Medication Instructions:   Your physician recommends that you continue on your current medications as directed. Please refer to the Current Medication list given to you today.     Labwork:  SOMETIME IN MAY 2020 TO CHECK--CMET, CBC W DIFF, TSH AND LIPIDS--PLEASE COME FASTING TO THIS LAB APPOINTMENT      Follow-Up:  Your physician wants you to follow-up in: [place] will receive a reminder letter in the mail two months in advance. If you don't receive a letter, please call our office to schedule the follow-up appointment.        If you need a refill on your cardiac medications before your next appointment, please call your pharmacy.

## 2018-05-15 NOTE — Addendum Note (Signed)
Addended by: Dorothy Spark on: 05/15/2018 11:34 AM   Modules accepted: Level of Service

## 2018-05-15 NOTE — Progress Notes (Signed)
Cardiology Office Note    Date:  05/15/2018   ID:  Chris Chung, DOB May 06, 1951, MRN [id number]  PCP:  [name], MD  Cardiologist:   Ena Dawley, MD   Chief complain: Palpitations, chest pain.  History of Present Illness:  Chris Chung is a 68 y.o. 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 He has a type A personality and has always been extremely busy traveling around the world and exercising for 5 times a week. Since he has retired a year ago and active but also slightly anxious about the current situation and not being so busy, this is an adjustment period for him from his previous busy lifestyle. He has noticed fluttering and skipped beats lasting few seconds approximately 2 months after he retired and they progressed to the point where he would feel them every few minutes and they would last up to a full day. He underwent 4 lab testing by his primary care physician including CMP, CBC TSH and everything was normal. He was given prescription for Xanax and metoprolol when necessary however he hasn't been using it. His palpitations have improved over time however they're persistent they can happen almost on a daily basis however some days they can happen every few minutes, however the only last 1 or 2 seconds at the time. He doesn't have any associated dizziness but they are very on stressful for him. He will feel retrosternal pressure-like pains lasting for a few seconds that is not related to exertion. He is an avid biker and states that rarely he feels palpitations while on a bike he mostly feels them at rest. He denies any lower extremity edema orthopnea or proximal nocturnal dyspnea. He has never smoked. There is no family history of premature coronary artery disease or sudden cardiac death. He underwent echocardiographic in October 2016 that showed normal LVEF only mild upper septal hypertrophy, impaired relaxation with normal  filling pressures and normal size of left atrium. He also underwent 48 hour Holter monitor that showed 5 PVCs in 24 hours, 90 PACs and one a very short run of atrial tachycardia lasting 3-4 seconds. No significant pauses, he's mean heart rate was in 70s to 80s.  03/08/2016 the patient is coming fr follow up, he feels great, less palpitations and anxiety. No chest pain, he is able to do all sports activities without any limitations. No syncope. His coronary CTA showed mild at most 25% stenosis, calcium score 52.  04/23/2017, this is one year follow-up, the patient continues to exercise almost every day, and has no symptoms of chest pain or shortness of breath. He feels occasional palpitations rarely lasting for several hours but the are just more frequent PVCs not sustained tachycardias. He denies any dizziness chest pain falls or syncope with it. He is tolerating rosuvastatin and denies any myalgias.  05/15/2018 -this is 1 year follow-up, the patient is feeling great, he is retired and continues to bike frequently.  He has no symptoms of chest pain shortness of breath, no claudication no lower extremity edema he has developed pain in the Achilles tendon at rest.  He has occasional palpitations that are second lasting and not associated with presyncope or syncope.   Past Medical History:  Diagnosis Date  . Anxiety   . Elevated PSA   . History of skin cancer   . Insomnia   . Palpitations     Past Surgical History:  Procedure Laterality Date  .  PROSTATE BIOPSY    . SKIN CANCER EXCISION    . TONSILLECTOMY      Current Medications: Outpatient Medications Prior to Visit  Medication Sig Dispense Refill  . ACYCLOVIR PO Take 500 mg by mouth daily.    . cholecalciferol (VITAMIN D) 1000 units tablet Take 1,000 Units by mouth daily.    . metoprolol tartrate (LOPRESSOR) 25 MG tablet TAKE 1/2 TABLET BY MOUTH TWICE DAILY 30 tablet 3  . minocycline (MINOCIN,DYNACIN) 50 MG capsule Take 50 mg by mouth as  needed (rarely takes , only when face breaks out.).     Marland Kitchen rosuvastatin (CRESTOR) 5 MG tablet TAKE 1 TABLET BY MOUTH THREE TIMES A WEEK 15 tablet 10   No facility-administered medications prior to visit.      Allergies:   Patient has no known allergies.   Social History   Socioeconomic History  . Marital status: Married    Spouse name: Not on file  . Number of children: 3  . Years of education: Not on file  . Highest education level: Not on file  Occupational History  . Occupation: RETIRED  Social Needs  . Financial resource strain: Not on file  . Food insecurity:    Worry: Not on file    Inability: Not on file  . Transportation needs:    Medical: Not on file    Non-medical: Not on file  Tobacco Use  . Smoking status: Never Smoker  . Smokeless tobacco: Never Used  Substance and Sexual Activity  . Alcohol use: No  . Drug use: No  . Sexual activity: Not on file  Lifestyle  . Physical activity:    Days per week: Not on file    Minutes per session: Not on file  . Stress: Not on file  Relationships  . Social connections:    Talks on phone: Not on file    Gets together: Not on file    Attends religious service: Not on file    Active member of club or organization: Not on file    Attends meetings of clubs or organizations: Not on file    Relationship status: Not on file  Other Topics Concern  . Not on file  Social History Narrative  . Not on file    Family History:  The patient's Negative for sudden cardiac death or premature coronary artery disease.  ROS:   Please see the history of present illness.    ROS All other systems reviewed and are negative.   PHYSICAL EXAM:   VS:  BP 110/80   Pulse 76   Ht 5\' 9"  (1.753 m)   Wt 172 lb 12.8 oz (78.4 kg)   SpO2 98%   BMI 25.52 kg/m    GEN: Well nourished, well developed, in no acute distress  HEENT: normal  Neck: no JVD, carotid bruits, or masses Cardiac: RRR; no murmurs, rubs, or gallops,no edema  Respiratory:   clear to auscultation bilaterally, normal work of breathing GI: soft, nontender, nondistended, + BS MS: no deformity or atrophy  Skin: warm and dry, no rash Neuro:  Alert and Oriented x 3, Strength and sensation are intact Psych: euthymic mood, full affect  Wt Readings from Last 3 Encounters:  05/15/18 172 lb 12.8 oz (78.4 kg)  04/23/17 170 lb (77.1 kg)  03/08/16 162 lb (73.5 kg)    Studies/Labs Reviewed:   EKG:  EKG is ordered today.  The ekg ordered today demonstrates sinus rhythm with ventricular rate 84 bpm is  completely normal EKG, when compared to prior heart rate is slower, this was personally reviewed.   Recent Labs: No results found for requested labs within last 8760 hours.   Lipid Panel    Component Value Date/Time   CHOL 151 07/17/2016 0819   TRIG 113 07/17/2016 0819   HDL 55 07/17/2016 0819   CHOLHDL 2.7 07/17/2016 0819   CHOLHDL 3.5 01/25/2016 0756   VLDL 25 01/25/2016 0756   LDLCALC 73 07/17/2016 0819    TTE: 02/11/2016 - Left ventricle: The cavity size was normal. There was mild focal   basal hypertrophy of the septum. Systolic function was normal.   The estimated ejection fraction was in the range of 60% to 65%.   Wall motion was normal; there were no regional wall motion   abnormalities. Doppler parameters are consistent with abnormal   left ventricular relaxation (grade 1 diastolic dysfunction). - Mitral valve: Transvalvular velocity was within the normal range.   There was no evidence for stenosis. There was no regurgitation. - Right ventricle: The cavity size was normal. Wall thickness was   normal. Systolic function was normal. - Atrial septum: No defect or patent foramen ovale was identified. - Tricuspid valve: There was trivial regurgitation. - Inferior vena cava: The vessel was normal in size. The   respirophasic diameter changes were in the normal range (= 50%),   consistent with normal central venous pressure.    ASSESSMENT:    1.  Coronary artery disease involving native coronary artery of native heart without angina pectoris   2. Other hyperlipidemia   3. PVC (premature ventricular contraction)     PLAN:  In order of problems listed above:  1. Palpitations,  24 hour Holter monitor that only showed PACs and PVCs, he symptoms have improved with metoprolol.  2. Hyperlipidemia - started rosuvastatin, he is tolerating it well, he had an episode of elevated LFTs on November 15 however when repeated in November 2015 they were all normal.  We will repeat in May 2020 for now continue the same management.   3.  Mild nonobstructive CAD, will continue medical management with low-dose Crestor, patient continues to exercise on a regular basis.  Medication Adjustments/Labs and Tests Ordered: Current medicines are reviewed at length with the patient today.  Concerns regarding medicines are outlined above.  Medication changes, Labs and Tests ordered today are listed in the Patient Instructions below. Patient Instructions  Medication Instructions:   Your physician recommends that you continue on your current medications as directed. Please refer to the Current Medication list given to you today.     Labwork:  SOMETIME IN MAY 2020 TO CHECK--CMET, CBC W DIFF, TSH AND LIPIDS--PLEASE COME FASTING TO THIS LAB APPOINTMENT      Follow-Up:  Your physician wants you to follow-up in: [place] will receive a reminder letter in the mail two months in advance. If you don't receive a letter, please call our office to schedule the follow-up appointment.        If you need a refill on your cardiac medications before your next appointment, please call your pharmacy.      Signed, Ena Dawley, MD  05/15/2018 11:33 AM    [address], [place], [place]  [address] Phone: [phone]; Fax: [phone]

## 2018-06-19 ENCOUNTER — Other Ambulatory Visit: Payer: Self-pay | Admitting: Cardiology

## 2018-08-12 ENCOUNTER — Other Ambulatory Visit: Payer: Self-pay | Admitting: Cardiology

## 2018-09-13 ENCOUNTER — Other Ambulatory Visit: Payer: Medicare Other

## 2018-10-18 DIAGNOSIS — H40013 Open angle with borderline findings, low risk, bilateral: Secondary | ICD-10-CM | POA: Diagnosis not present

## 2018-10-22 ENCOUNTER — Other Ambulatory Visit: Payer: Self-pay

## 2018-10-22 ENCOUNTER — Encounter (INDEPENDENT_AMBULATORY_CARE_PROVIDER_SITE_OTHER): Payer: Medicare Other | Admitting: Ophthalmology

## 2018-10-22 DIAGNOSIS — H34812 Central retinal vein occlusion, left eye, with macular edema: Secondary | ICD-10-CM | POA: Diagnosis not present

## 2018-10-22 DIAGNOSIS — I10 Essential (primary) hypertension: Secondary | ICD-10-CM

## 2018-10-22 DIAGNOSIS — H43813 Vitreous degeneration, bilateral: Secondary | ICD-10-CM | POA: Diagnosis not present

## 2018-10-22 DIAGNOSIS — H2513 Age-related nuclear cataract, bilateral: Secondary | ICD-10-CM

## 2018-10-22 DIAGNOSIS — H35033 Hypertensive retinopathy, bilateral: Secondary | ICD-10-CM | POA: Diagnosis not present

## 2018-11-04 DIAGNOSIS — C4491 Basal cell carcinoma of skin, unspecified: Secondary | ICD-10-CM

## 2018-11-04 HISTORY — DX: Basal cell carcinoma of skin, unspecified: C44.91

## 2018-11-05 DIAGNOSIS — D0439 Carcinoma in situ of skin of other parts of face: Secondary | ICD-10-CM | POA: Diagnosis not present

## 2018-11-05 DIAGNOSIS — D485 Neoplasm of uncertain behavior of skin: Secondary | ICD-10-CM | POA: Diagnosis not present

## 2018-11-05 DIAGNOSIS — B009 Herpesviral infection, unspecified: Secondary | ICD-10-CM | POA: Diagnosis not present

## 2018-11-05 DIAGNOSIS — L820 Inflamed seborrheic keratosis: Secondary | ICD-10-CM | POA: Diagnosis not present

## 2018-11-05 DIAGNOSIS — C4492 Squamous cell carcinoma of skin, unspecified: Secondary | ICD-10-CM

## 2018-11-05 DIAGNOSIS — D225 Melanocytic nevi of trunk: Secondary | ICD-10-CM | POA: Diagnosis not present

## 2018-11-05 HISTORY — DX: Squamous cell carcinoma of skin, unspecified: C44.92

## 2018-11-06 ENCOUNTER — Other Ambulatory Visit: Payer: Self-pay | Admitting: Cardiology

## 2018-11-15 DIAGNOSIS — F419 Anxiety disorder, unspecified: Secondary | ICD-10-CM | POA: Diagnosis not present

## 2018-11-15 DIAGNOSIS — B009 Herpesviral infection, unspecified: Secondary | ICD-10-CM | POA: Diagnosis not present

## 2018-11-15 DIAGNOSIS — H348192 Central retinal vein occlusion, unspecified eye, stable: Secondary | ICD-10-CM | POA: Diagnosis not present

## 2018-11-18 ENCOUNTER — Encounter (INDEPENDENT_AMBULATORY_CARE_PROVIDER_SITE_OTHER): Payer: Medicare Other | Admitting: Ophthalmology

## 2018-11-18 ENCOUNTER — Other Ambulatory Visit: Payer: Self-pay

## 2018-11-18 DIAGNOSIS — I10 Essential (primary) hypertension: Secondary | ICD-10-CM | POA: Diagnosis not present

## 2018-11-18 DIAGNOSIS — H43813 Vitreous degeneration, bilateral: Secondary | ICD-10-CM | POA: Diagnosis not present

## 2018-11-18 DIAGNOSIS — H35033 Hypertensive retinopathy, bilateral: Secondary | ICD-10-CM | POA: Diagnosis not present

## 2018-11-18 DIAGNOSIS — H2513 Age-related nuclear cataract, bilateral: Secondary | ICD-10-CM | POA: Diagnosis not present

## 2018-11-18 DIAGNOSIS — H34812 Central retinal vein occlusion, left eye, with macular edema: Secondary | ICD-10-CM | POA: Diagnosis not present

## 2018-12-16 ENCOUNTER — Encounter (INDEPENDENT_AMBULATORY_CARE_PROVIDER_SITE_OTHER): Payer: Medicare Other | Admitting: Ophthalmology

## 2018-12-16 ENCOUNTER — Other Ambulatory Visit: Payer: Self-pay

## 2018-12-16 DIAGNOSIS — H34812 Central retinal vein occlusion, left eye, with macular edema: Secondary | ICD-10-CM | POA: Diagnosis not present

## 2018-12-16 DIAGNOSIS — H35033 Hypertensive retinopathy, bilateral: Secondary | ICD-10-CM

## 2018-12-16 DIAGNOSIS — H2513 Age-related nuclear cataract, bilateral: Secondary | ICD-10-CM | POA: Diagnosis not present

## 2018-12-16 DIAGNOSIS — H43813 Vitreous degeneration, bilateral: Secondary | ICD-10-CM

## 2018-12-16 DIAGNOSIS — I10 Essential (primary) hypertension: Secondary | ICD-10-CM

## 2019-01-14 ENCOUNTER — Encounter (INDEPENDENT_AMBULATORY_CARE_PROVIDER_SITE_OTHER): Payer: Medicare Other | Admitting: Ophthalmology

## 2019-01-14 ENCOUNTER — Other Ambulatory Visit: Payer: Self-pay

## 2019-01-14 DIAGNOSIS — I10 Essential (primary) hypertension: Secondary | ICD-10-CM

## 2019-01-14 DIAGNOSIS — H2513 Age-related nuclear cataract, bilateral: Secondary | ICD-10-CM

## 2019-01-14 DIAGNOSIS — H34812 Central retinal vein occlusion, left eye, with macular edema: Secondary | ICD-10-CM

## 2019-01-14 DIAGNOSIS — H35033 Hypertensive retinopathy, bilateral: Secondary | ICD-10-CM

## 2019-01-14 DIAGNOSIS — H43813 Vitreous degeneration, bilateral: Secondary | ICD-10-CM

## 2019-01-23 DIAGNOSIS — Z23 Encounter for immunization: Secondary | ICD-10-CM | POA: Diagnosis not present

## 2019-02-11 ENCOUNTER — Encounter (INDEPENDENT_AMBULATORY_CARE_PROVIDER_SITE_OTHER): Payer: Medicare Other | Admitting: Ophthalmology

## 2019-02-11 DIAGNOSIS — H34812 Central retinal vein occlusion, left eye, with macular edema: Secondary | ICD-10-CM

## 2019-02-11 DIAGNOSIS — H35033 Hypertensive retinopathy, bilateral: Secondary | ICD-10-CM

## 2019-02-11 DIAGNOSIS — H2513 Age-related nuclear cataract, bilateral: Secondary | ICD-10-CM

## 2019-02-11 DIAGNOSIS — H43813 Vitreous degeneration, bilateral: Secondary | ICD-10-CM

## 2019-02-11 DIAGNOSIS — I10 Essential (primary) hypertension: Secondary | ICD-10-CM | POA: Diagnosis not present

## 2019-03-11 ENCOUNTER — Encounter (INDEPENDENT_AMBULATORY_CARE_PROVIDER_SITE_OTHER): Payer: Medicare Other | Admitting: Ophthalmology

## 2019-03-11 DIAGNOSIS — H2513 Age-related nuclear cataract, bilateral: Secondary | ICD-10-CM | POA: Diagnosis not present

## 2019-03-11 DIAGNOSIS — H43813 Vitreous degeneration, bilateral: Secondary | ICD-10-CM

## 2019-03-11 DIAGNOSIS — I10 Essential (primary) hypertension: Secondary | ICD-10-CM | POA: Diagnosis not present

## 2019-03-11 DIAGNOSIS — H35033 Hypertensive retinopathy, bilateral: Secondary | ICD-10-CM | POA: Diagnosis not present

## 2019-03-11 DIAGNOSIS — H34812 Central retinal vein occlusion, left eye, with macular edema: Secondary | ICD-10-CM | POA: Diagnosis not present

## 2019-04-09 DIAGNOSIS — Z0001 Encounter for general adult medical examination with abnormal findings: Secondary | ICD-10-CM | POA: Diagnosis not present

## 2019-04-09 DIAGNOSIS — Z79899 Other long term (current) drug therapy: Secondary | ICD-10-CM | POA: Diagnosis not present

## 2019-04-09 DIAGNOSIS — E7800 Pure hypercholesterolemia, unspecified: Secondary | ICD-10-CM | POA: Diagnosis not present

## 2019-04-09 DIAGNOSIS — I10 Essential (primary) hypertension: Secondary | ICD-10-CM | POA: Diagnosis not present

## 2019-04-09 DIAGNOSIS — I493 Ventricular premature depolarization: Secondary | ICD-10-CM | POA: Diagnosis not present

## 2019-04-09 DIAGNOSIS — F419 Anxiety disorder, unspecified: Secondary | ICD-10-CM | POA: Diagnosis not present

## 2019-04-10 ENCOUNTER — Encounter (INDEPENDENT_AMBULATORY_CARE_PROVIDER_SITE_OTHER): Payer: Medicare Other | Admitting: Ophthalmology

## 2019-04-10 ENCOUNTER — Other Ambulatory Visit: Payer: Self-pay

## 2019-04-10 DIAGNOSIS — H35033 Hypertensive retinopathy, bilateral: Secondary | ICD-10-CM

## 2019-04-10 DIAGNOSIS — H43813 Vitreous degeneration, bilateral: Secondary | ICD-10-CM | POA: Diagnosis not present

## 2019-04-10 DIAGNOSIS — H34812 Central retinal vein occlusion, left eye, with macular edema: Secondary | ICD-10-CM | POA: Diagnosis not present

## 2019-04-10 DIAGNOSIS — H2513 Age-related nuclear cataract, bilateral: Secondary | ICD-10-CM

## 2019-04-10 DIAGNOSIS — I10 Essential (primary) hypertension: Secondary | ICD-10-CM

## 2019-04-29 ENCOUNTER — Other Ambulatory Visit: Payer: Self-pay | Admitting: Cardiology

## 2019-05-05 ENCOUNTER — Encounter: Payer: Self-pay | Admitting: *Deleted

## 2019-05-13 NOTE — Progress Notes (Signed)
Cardiology Office Note    Date:  05/14/2019   ID:  Chris Chung, DOB 11/24/1950, MRN [id number]  PCP:  Chris Amel, MD  Cardiologist: Chris Dawley, MD EPS: None  Chief Complaint  Patient presents with  . Follow-up    History of Present Illness:  Chris Chung is a 69 y.o. adult with history of palpitations 2 months after he retired . 48 hr monitor 2016 short run atrial tachycardia 3-4 seconds mean HR 70-80's treated with metoprolol. Echo normal LVEF with mild upper septal hypertrophy with impaired relaxation. HLD on statin. Coronary CTA  9/2017calcium score 52-40th percentile with mild non-obstructive diffuse CAD. Brother had CABG 41.   Last office visit with Dr. Meda Chung 05/15/2018 he was exercising regularly and feeling well.  Patient denies chest pain, dyspnea on exertion, dizziness or presyncope.Occassional heart skipping.  Bikes for about an hour 5 days/week and lift weights. 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  Having avastin injections into eye every 4 weeks.    Past Medical History:  Diagnosis Date  . Abnormal computed tomography angiography (CTA) 01/21/2016  . Anxiety   . Elevated PSA   . History of skin cancer   . Hyperlipidemia 01/21/2016  . Insomnia   . Palpitations   . 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 Vertigo 07/04/2016    Past Surgical History:  Procedure Laterality Date  . PROSTATE BIOPSY    . SKIN CANCER EXCISION    . TONSILLECTOMY      Current Medications: Current Meds  Medication Sig  . ACYCLOVIR PO Take 500 mg by mouth daily.  . cholecalciferol (VITAMIN D) 1000 units tablet Take 1,000 Units by mouth daily.  . metoprolol tartrate (LOPRESSOR) 25 MG tablet TAKE 1/2 TABLET BY MOUTH TWICE DAILY. PLEASE KEEP UPCOMING APPOINTMENT FOR FUTURE REFILLS  . minocycline (MINOCIN,DYNACIN) 50 MG capsule Take 50 mg by mouth as needed (rarely takes , only when face breaks out.).   Marland Kitchen rosuvastatin (CRESTOR) 5 MG tablet TAKE 1 TABLET BY MOUTH THREE TIMES A WEEK       Allergies:   Patient has no known allergies.   Social History   Socioeconomic History  . Marital status: Married    Spouse name: Not on file  . Number of children: 3  . Years of education: Not on file  . Highest education level: Not on file  Occupational History  . Occupation: RETIRED  Tobacco Use  . Smoking status: Never Smoker  . Smokeless tobacco: Never Used  Substance and Sexual Activity  . Alcohol use: No  . Drug use: No  . Sexual activity: Not on file  Other Topics Concern  . Not on file  Social History Narrative  . Not on file   Social Determinants of Health   Financial Resource Strain:   . Difficulty of Paying Living Expenses: Not on file  Food Insecurity:   . Worried About Charity fundraiser in the Last Year: Not on file  . Ran Out of Food in the Last Year: Not on file  Transportation Needs:   . Lack of Transportation (Medical): Not on file  . Lack of Transportation (Non-Medical): Not on file  Physical Activity:   . Days of Exercise per Week: Not on file  . Minutes of Exercise per Session: Not on file  Stress:   . Feeling of Stress : Not on file  Social Connections:   . Frequency of Communication with Friends and Family: Not on file  . Frequency of Social Gatherings  with Friends and Family: Not on file  . Attends Religious Services: Not on file  . Active Member of Clubs or Organizations: Not on file  . Attends Archivist Meetings: Not on file  . Marital Status: Not on file     Family History:  The patient's   family history is not on file.   ROS:   Please see the history of present illness.    ROS All other systems reviewed and are negative.   PHYSICAL EXAM:   VS:  BP 112/70   Pulse 83   Ht 5\' 9"  (1.753 m)   Wt 165 lb 9.6 oz (75.1 kg)   SpO2 98%   BMI 24.45 kg/m   Physical Exam  GEN: Thin, in no acute distress  Neck: no JVD, carotid bruits, or masses Cardiac:RRR; S4,no murmurs, rubs, Respiratory:  clear to auscultation  bilaterally, normal work of breathing GI: soft, nontender, nondistended, + BS Ext: without cyanosis, clubbing, or edema, Good distal pulses bilaterally Neuro:  Alert and Oriented x 3 Psych: euthymic mood, full affect  Wt Readings from Last 3 Encounters:  05/14/19 165 lb 9.6 oz (75.1 kg)  05/15/18 172 lb 12.8 oz (78.4 kg)  04/23/17 170 lb (77.1 kg)      Studies/Labs Reviewed:   EKG:  EKG is  ordered today.  The ekg ordered today demonstrates NSR no acute change  Recent Labs: No results found for requested labs within last 8760 hours.   Lipid Panel    Component Value Date/Time   CHOL 151 07/17/2016 0819   TRIG 113 07/17/2016 0819   HDL 55 07/17/2016 0819   CHOLHDL 2.7 07/17/2016 0819   CHOLHDL 3.5 01/25/2016 0756   VLDL 25 01/25/2016 0756   LDLCALC 73 07/17/2016 0819    Additional studies/ records that were reviewed today include:  Coronary CTA 9/2017IMPRESSION: 1. Coronary calcium score of 52. This was 15 percentile for age and sex matched control.   2. Normal coronary origin with right dominance.   3. Mild diffuse non-obstructive CAD, an aggressive risk factor modification is recommended.   Chris Chung     Electronically Signed   By: Chris Chung   On: 01/20/2016 14:39       ASSESSMENT:    1. Abnormal computed tomography angiography (CTA)   2. Heart palpitations   3. Hyperlipidemia, unspecified hyperlipidemia type      PLAN:  In order of problems listed above:  Nonobstructive CAD on coronary CTA 01/2016-no symptoms  Palpitations with PACs and PVCs and atrial tachycardia controlled on metoprolol  Hyperlipidemia on rosuvastatin LDL 75 04/23/19    Medication Adjustments/Labs and Tests Ordered: Current medicines are reviewed at length with the patient today.  Concerns regarding medicines are outlined above.  Medication changes, Labs and Tests ordered today are listed in the Patient Instructions below. Patient Instructions  Medication  Instructions:  Your physician recommends that you continue on your current medications as directed. Please refer to the Current Medication list given to you today.  *If you need a refill on your cardiac medications before your next appointment, please call your pharmacy*  Lab Work: None ordered  If you have labs (blood work) drawn today and your tests are completely normal, you will receive your results only by: Marland Kitchen MyChart Message (if you have MyChart) OR . A paper copy in the mail If you have any lab test that is abnormal or we need to change your treatment, we will call you to review the  results.  Testing/Procedures: None ordered  Follow-Up: At [facility], you and your health needs are our priority.  As part of our continuing mission to provide you with exceptional heart care, we have created designated Provider Care Teams.  These Care Teams include your primary Cardiologist (physician) and Advanced Practice Providers (APPs -  Physician Assistants and Nurse Practitioners) who all work together to provide you with the care you need, when you need it.  Your next appointment:   12 month(s)  The format for your next appointment:   In Person  Provider:   You may see Chris Dawley, MD or one of the following Advanced Practice Providers on your designated Care Team:    Melina Copa, PA-C  Ermalinda Barrios, PA-C   Other Instructions     Signed, Ermalinda Barrios, PA-C  05/14/2019 9:56 AM    [address], [place],   [address] Phone: [phone]; Fax: [phone]

## 2019-05-14 ENCOUNTER — Encounter: Payer: Self-pay | Admitting: Physician Assistant

## 2019-05-14 ENCOUNTER — Ambulatory Visit (INDEPENDENT_AMBULATORY_CARE_PROVIDER_SITE_OTHER): Payer: Medicare Other | Admitting: Physician Assistant

## 2019-05-14 ENCOUNTER — Other Ambulatory Visit: Payer: Self-pay

## 2019-05-14 VITALS — BP 112/70 | HR 83 | Ht 69.0 in | Wt 165.6 lb

## 2019-05-14 DIAGNOSIS — R002 Palpitations: Secondary | ICD-10-CM | POA: Diagnosis not present

## 2019-05-14 DIAGNOSIS — E785 Hyperlipidemia, unspecified: Secondary | ICD-10-CM

## 2019-05-14 DIAGNOSIS — R9389 Abnormal findings on diagnostic imaging of other specified body structures: Secondary | ICD-10-CM

## 2019-05-14 NOTE — Patient Instructions (Signed)
Medication Instructions:  Your physician recommends that you continue on your current medications as directed. Please refer to the Current Medication list given to you today.  *If you need a refill on your cardiac medications before your next appointment, please call your pharmacy*  Lab Work: None ordered  If you have labs (blood work) drawn today and your tests are completely normal, you will receive your results only by: . MyChart Message (if you have MyChart) OR . A paper copy in the mail If you have any lab test that is abnormal or we need to change your treatment, we will call you to review the results.  Testing/Procedures: None ordered  Follow-Up: At [facility], you and your health needs are our priority.  As part of our continuing mission to provide you with exceptional heart care, we have created designated Provider Care Teams.  These Care Teams include your primary Cardiologist (physician) and Advanced Practice Providers (APPs -  Physician Assistants and Nurse Practitioners) who all work together to provide you with the care you need, when you need it.  Your next appointment:   12 month(s)  The format for your next appointment:   In Person  Provider:   You may see Katarina Nelson, MD or one of the following Advanced Practice Providers on your designated Care Team:    Dayna Dunn, PA-C  Michele Lenze, PA-C   Other Instructions   

## 2019-05-15 ENCOUNTER — Encounter (INDEPENDENT_AMBULATORY_CARE_PROVIDER_SITE_OTHER): Payer: Medicare Other | Admitting: Ophthalmology

## 2019-05-15 DIAGNOSIS — I10 Essential (primary) hypertension: Secondary | ICD-10-CM

## 2019-05-15 DIAGNOSIS — H35033 Hypertensive retinopathy, bilateral: Secondary | ICD-10-CM | POA: Diagnosis not present

## 2019-05-15 DIAGNOSIS — H2513 Age-related nuclear cataract, bilateral: Secondary | ICD-10-CM | POA: Diagnosis not present

## 2019-05-15 DIAGNOSIS — H43813 Vitreous degeneration, bilateral: Secondary | ICD-10-CM | POA: Diagnosis not present

## 2019-05-15 DIAGNOSIS — H34812 Central retinal vein occlusion, left eye, with macular edema: Secondary | ICD-10-CM | POA: Diagnosis not present

## 2019-05-16 ENCOUNTER — Telehealth: Payer: Self-pay | Admitting: Cardiology

## 2019-05-16 DIAGNOSIS — E785 Hyperlipidemia, unspecified: Secondary | ICD-10-CM

## 2019-05-16 MED ORDER — ROSUVASTATIN CALCIUM 5 MG PO TABS: ORAL_TABLET | ORAL | 10 refills | Status: DC

## 2019-05-16 NOTE — Telephone Encounter (Signed)
Patient is returning call for lab results. Please call.

## 2019-05-16 NOTE — Telephone Encounter (Signed)
Result Notes   Cleon Gustin, RN  05/16/2019 12:29 PM EST    Called and made patient aware of results and recommendations to increase crestor to 5 mg 5x/week. Repeat Lipids scheduled for 3/31. Patient verbalized understanding and thanked me for the call.   Drue Novel I, RN  05/16/2019 11:49 AM EST    Left message for patient to call back.   Imogene Burn, PA-C  05/16/2019 9:45 AM EST    LDL 75, goal is less than 70. Consider increasing crestor 5 mg 5 days/week. If agreeable recheck flp in 3 months. thanks

## 2019-05-26 ENCOUNTER — Other Ambulatory Visit: Payer: Self-pay | Admitting: Cardiology

## 2019-05-28 ENCOUNTER — Telehealth: Payer: Self-pay | Admitting: Cardiology

## 2019-05-28 MED ORDER — METOPROLOL TARTRATE 25 MG PO TABS: ORAL_TABLET | ORAL | 3 refills | Status: DC

## 2019-05-28 NOTE — Telephone Encounter (Signed)
Pt's medication was sent to pt's pharmacy as requested. Confirmation received.  °

## 2019-05-28 NOTE — Telephone Encounter (Signed)
New Message     *STAT* If patient is at the pharmacy, call can be transferred to refill team.   1. Which medications need to be refilled? (please list name of each medication and dose if known)   metoprolol tartrate (LOPRESSOR) 25 MG tablet     2. Which pharmacy/location (including street and city if local pharmacy) is medication to be sent to? [facility] [place], [place] AT [address]  3. Do they need a 30 day or 90 day supply? [place]

## 2019-05-29 ENCOUNTER — Encounter (INDEPENDENT_AMBULATORY_CARE_PROVIDER_SITE_OTHER): Payer: Medicare Other | Admitting: Ophthalmology

## 2019-05-29 DIAGNOSIS — H34812 Central retinal vein occlusion, left eye, with macular edema: Secondary | ICD-10-CM | POA: Diagnosis not present

## 2019-05-29 DIAGNOSIS — H2513 Age-related nuclear cataract, bilateral: Secondary | ICD-10-CM | POA: Diagnosis not present

## 2019-05-29 DIAGNOSIS — H43813 Vitreous degeneration, bilateral: Secondary | ICD-10-CM | POA: Diagnosis not present

## 2019-05-29 DIAGNOSIS — H35033 Hypertensive retinopathy, bilateral: Secondary | ICD-10-CM

## 2019-05-29 DIAGNOSIS — I10 Essential (primary) hypertension: Secondary | ICD-10-CM

## 2019-06-12 ENCOUNTER — Encounter (INDEPENDENT_AMBULATORY_CARE_PROVIDER_SITE_OTHER): Payer: Medicare Other | Admitting: Ophthalmology

## 2019-06-12 DIAGNOSIS — H35033 Hypertensive retinopathy, bilateral: Secondary | ICD-10-CM

## 2019-06-12 DIAGNOSIS — H43813 Vitreous degeneration, bilateral: Secondary | ICD-10-CM | POA: Diagnosis not present

## 2019-06-12 DIAGNOSIS — H34812 Central retinal vein occlusion, left eye, with macular edema: Secondary | ICD-10-CM

## 2019-06-12 DIAGNOSIS — I10 Essential (primary) hypertension: Secondary | ICD-10-CM | POA: Diagnosis not present

## 2019-06-12 DIAGNOSIS — H2513 Age-related nuclear cataract, bilateral: Secondary | ICD-10-CM

## 2019-06-15 ENCOUNTER — Ambulatory Visit: Payer: Medicare Other | Attending: Internal Medicine

## 2019-06-15 DIAGNOSIS — Z23 Encounter for immunization: Secondary | ICD-10-CM | POA: Insufficient documentation

## 2019-06-15 NOTE — Progress Notes (Signed)
   Covid-19 Vaccination Clinic  Name:  Chris Chung    MRN: [id number] DOB: 04-10-1951  06/15/2019  Chris Chung was observed post Covid-19 immunization for 15 minutes without incidence. He was provided with Vaccine Information Sheet and instruction to access the V-Safe system.   Chris Chung was instructed to call 911 with any severe reactions post vaccine: Marland Kitchen Difficulty breathing  . Swelling of your face and throat  . A fast heartbeat  . A bad rash all over your body  . Dizziness and weakness    Immunizations Administered    Name Date Dose VIS Date Route   Pfizer COVID-19 Vaccine 06/15/2019  4:20 PM 0.3 mL 04/18/2019 Intramuscular   Manufacturer: [place]   Lot: [id number]   [place]: [id number]

## 2019-07-01 ENCOUNTER — Ambulatory Visit: Payer: Medicare Other

## 2019-07-09 ENCOUNTER — Ambulatory Visit: Payer: Medicare Other

## 2019-07-09 ENCOUNTER — Ambulatory Visit: Payer: Medicare Other | Attending: Internal Medicine

## 2019-07-09 DIAGNOSIS — Z23 Encounter for immunization: Secondary | ICD-10-CM

## 2019-07-09 NOTE — Progress Notes (Signed)
   Covid-19 Vaccination Clinic  Name:  Chris Chung    MRN: [id number] DOB: April 05, 1951  07/09/2019  [name] was observed post Covid-19 immunization for 15 minutes without incident. He was provided with Vaccine Information Sheet and instruction to access the V-Safe system.   [name] was instructed to call 911 with any severe reactions post vaccine: Marland Kitchen Difficulty breathing  . Swelling of face and throat  . A fast heartbeat  . A bad rash all over body  . Dizziness and weakness   Immunizations Administered    Name Date Dose VIS Date Route   Pfizer COVID-19 Vaccine 07/09/2019 10:01 AM 0.3 mL 04/18/2019 Intramuscular   Manufacturer: [place]   Lot: [id number]   [place]: [id number]

## 2019-07-10 ENCOUNTER — Encounter (INDEPENDENT_AMBULATORY_CARE_PROVIDER_SITE_OTHER): Payer: Medicare Other | Admitting: Ophthalmology

## 2019-07-10 DIAGNOSIS — I10 Essential (primary) hypertension: Secondary | ICD-10-CM | POA: Diagnosis not present

## 2019-07-10 DIAGNOSIS — H34812 Central retinal vein occlusion, left eye, with macular edema: Secondary | ICD-10-CM | POA: Diagnosis not present

## 2019-07-10 DIAGNOSIS — H35033 Hypertensive retinopathy, bilateral: Secondary | ICD-10-CM

## 2019-07-10 DIAGNOSIS — H43813 Vitreous degeneration, bilateral: Secondary | ICD-10-CM

## 2019-07-21 ENCOUNTER — Other Ambulatory Visit: Payer: Self-pay

## 2019-07-21 MED ORDER — VALACYCLOVIR HCL 500 MG PO TABS: 500.0000 mg | ORAL_TABLET | Freq: Two times a day (BID) | ORAL | 5 refills | Status: DC

## 2019-08-06 ENCOUNTER — Other Ambulatory Visit: Payer: Medicare Other | Admitting: *Deleted

## 2019-08-06 ENCOUNTER — Other Ambulatory Visit: Payer: Self-pay

## 2019-08-06 DIAGNOSIS — E785 Hyperlipidemia, unspecified: Secondary | ICD-10-CM | POA: Diagnosis not present

## 2019-08-06 LAB — LIPID PANEL
Chol/HDL Ratio: 2.6 ratio (ref 0.0–5.0)
Cholesterol, Total: 134 mg/dL (ref 100–199)
HDL: 52 mg/dL (ref >39)
LDL Chol Calc (NIH): 60 mg/dL (ref 0–99)
Triglycerides: 124 mg/dL (ref 0–149)
VLDL Cholesterol Cal: 22 mg/dL (ref 5–40)

## 2019-08-14 ENCOUNTER — Encounter (INDEPENDENT_AMBULATORY_CARE_PROVIDER_SITE_OTHER): Payer: Medicare Other | Admitting: Ophthalmology

## 2019-08-14 ENCOUNTER — Other Ambulatory Visit: Payer: Self-pay

## 2019-08-14 DIAGNOSIS — H35033 Hypertensive retinopathy, bilateral: Secondary | ICD-10-CM | POA: Diagnosis not present

## 2019-08-14 DIAGNOSIS — H34812 Central retinal vein occlusion, left eye, with macular edema: Secondary | ICD-10-CM

## 2019-08-14 DIAGNOSIS — H43813 Vitreous degeneration, bilateral: Secondary | ICD-10-CM | POA: Diagnosis not present

## 2019-08-14 DIAGNOSIS — H2513 Age-related nuclear cataract, bilateral: Secondary | ICD-10-CM | POA: Diagnosis not present

## 2019-08-14 DIAGNOSIS — I10 Essential (primary) hypertension: Secondary | ICD-10-CM

## 2019-09-02 ENCOUNTER — Encounter: Payer: Self-pay | Admitting: Dermatology

## 2019-09-02 ENCOUNTER — Other Ambulatory Visit: Payer: Self-pay

## 2019-09-02 ENCOUNTER — Ambulatory Visit (INDEPENDENT_AMBULATORY_CARE_PROVIDER_SITE_OTHER): Payer: Medicare Other | Admitting: Dermatology

## 2019-09-02 DIAGNOSIS — D485 Neoplasm of uncertain behavior of skin: Secondary | ICD-10-CM

## 2019-09-02 DIAGNOSIS — L820 Inflamed seborrheic keratosis: Secondary | ICD-10-CM | POA: Diagnosis not present

## 2019-09-02 DIAGNOSIS — L570 Actinic keratosis: Secondary | ICD-10-CM

## 2019-09-02 DIAGNOSIS — L719 Rosacea, unspecified: Secondary | ICD-10-CM | POA: Diagnosis not present

## 2019-09-02 DIAGNOSIS — Z85828 Personal history of other malignant neoplasm of skin: Secondary | ICD-10-CM

## 2019-09-02 DIAGNOSIS — C44519 Basal cell carcinoma of skin of other part of trunk: Secondary | ICD-10-CM | POA: Diagnosis not present

## 2019-09-02 MED ORDER — MINOCYCLINE HCL 50 MG PO CAPS: 50.0000 mg | ORAL_CAPSULE | Freq: Two times a day (BID) | ORAL | 3 refills | Status: DC

## 2019-09-02 NOTE — Patient Instructions (Signed)

## 2019-09-02 NOTE — Progress Notes (Addendum)
     Subjective  Chris Chung is a 69 y.o. adult who presents for the following: Annual Exam (Place on his back right shoulder, places on his arms and increase in pink spots across scalp.  Right posterior shoulder does itch occasionally.  Nothing with any bleeding or flaking.) and Medication Refill (Can we refill his MCN 50mg  for his rosacea.  Still takes it PRN.).  New growths Location: Back of neck and upper right back Duration: several months Quality: Increased size Associated Signs/Symptoms: Neck lesion sometimes sore Modifying Factors:  Severity:  Timing: Context: History of multiple skin cancers  The following portions of the chart were reviewed this encounter and updated as appropriate: Tobacco  Allergies  Meds  Problems  Med Hx  Surg Hx  Fam Hx      Objective  Well appearing patient in no apparent distress; mood and affect are within normal limits.  All skin waist up examined.   Assessment & Plan  Neoplasm of uncertain behavior of skin (2) Right upper back  Skin / nail biopsy Type of biopsy: tangential   Informed consent: discussed and consent obtained   Timeout: patient name, date of birth, surgical site, and procedure verified   Procedure prep:  Patient was prepped and draped in usual sterile fashion (Non sterile) Prep type:  Chlorhexidine Anesthesia: the lesion was anesthetized in a standard fashion   Anesthetic:  1% lidocaine w/ epinephrine 1-100,000 local infiltration Instrument used: flexible razor blade   Hemostasis achieved with: ferric subsulfate   Outcome: patient tolerated procedure well   Post-procedure details: sterile dressing applied and wound care instructions given   Dressing type: bandage and petrolatum   Additional details:  Patient identified lesion of concern.  Lesion identified by physician.  Specimen 1 - Surgical pathology Differential Diagnosis: SCC Check Margins: No  Neck - Posterior  Skin / nail biopsy Type of biopsy:  tangential   Informed consent: discussed and consent obtained   Timeout: patient name, date of birth, surgical site, and procedure verified   Procedure prep:  Patient was prepped and draped in usual sterile fashion (Non sterile) Prep type:  Chlorhexidine Anesthesia: the lesion was anesthetized in a standard fashion   Anesthetic:  1% lidocaine w/ epinephrine 1-100,000 local infiltration Instrument used: flexible razor blade   Hemostasis achieved with: ferric subsulfate   Outcome: patient tolerated procedure well   Post-procedure details: sterile dressing applied and wound care instructions given   Dressing type: bandage and petrolatum    Specimen 2 - Surgical pathology Differential Diagnosis: BCC vs SCC vs ISK Check Margins: No  Rosacea (2) Left Buccal Cheek ; Right Buccal Cheek   Well controlled with prn 50mg  minocycline; reminded of side effects and not to take if given other antibiotics.  minocycline (MINOCIN) 50 MG capsule - Left Buccal Cheek , Right Buccal Cheek   AK (actinic keratosis) (5) Mid Frontal Scalp  Destruction of lesion - Mid Frontal Scalp (2) Complexity: simple   Destruction method: cryotherapy   Informed consent: discussed and consent obtained   Timeout:  patient name, date of birth, surgical site, and procedure verified Lesion destroyed using liquid nitrogen: Yes   Region frozen until ice ball extended beyond lesion: Yes   Outcome: patient tolerated procedure well with no complications

## 2019-09-04 ENCOUNTER — Telehealth: Payer: Self-pay

## 2019-09-04 NOTE — Telephone Encounter (Signed)
-----   Message from Lavonna Monarch, MD sent at 09/04/2019  6:36 AM EDT ----- Schedule surgery with Dr. Darene Lamer

## 2019-09-04 NOTE — Telephone Encounter (Signed)
Phone call to patient with his Pathology results.  Patient aware of results patient already had an appointment scheduled with Dr. Denna Haggard for surgery.

## 2019-09-06 ENCOUNTER — Encounter: Payer: Self-pay | Admitting: Dermatology

## 2019-09-11 ENCOUNTER — Encounter (INDEPENDENT_AMBULATORY_CARE_PROVIDER_SITE_OTHER): Payer: Medicare Other | Admitting: Ophthalmology

## 2019-09-11 ENCOUNTER — Other Ambulatory Visit: Payer: Self-pay

## 2019-09-11 DIAGNOSIS — I10 Essential (primary) hypertension: Secondary | ICD-10-CM | POA: Diagnosis not present

## 2019-09-11 DIAGNOSIS — H35033 Hypertensive retinopathy, bilateral: Secondary | ICD-10-CM

## 2019-09-11 DIAGNOSIS — H43813 Vitreous degeneration, bilateral: Secondary | ICD-10-CM

## 2019-09-11 DIAGNOSIS — H34812 Central retinal vein occlusion, left eye, with macular edema: Secondary | ICD-10-CM | POA: Diagnosis not present

## 2019-09-29 ENCOUNTER — Encounter: Payer: Self-pay | Admitting: *Deleted

## 2019-10-02 ENCOUNTER — Encounter: Payer: Self-pay | Admitting: Dermatology

## 2019-10-02 ENCOUNTER — Ambulatory Visit (INDEPENDENT_AMBULATORY_CARE_PROVIDER_SITE_OTHER): Payer: Medicare Other | Admitting: Dermatology

## 2019-10-02 ENCOUNTER — Other Ambulatory Visit: Payer: Self-pay

## 2019-10-02 DIAGNOSIS — D485 Neoplasm of uncertain behavior of skin: Secondary | ICD-10-CM

## 2019-10-02 DIAGNOSIS — C44519 Basal cell carcinoma of skin of other part of trunk: Secondary | ICD-10-CM | POA: Diagnosis not present

## 2019-10-02 DIAGNOSIS — L738 Other specified follicular disorders: Secondary | ICD-10-CM | POA: Diagnosis not present

## 2019-10-02 DIAGNOSIS — C44329 Squamous cell carcinoma of skin of other parts of face: Secondary | ICD-10-CM | POA: Diagnosis not present

## 2019-10-02 DIAGNOSIS — C4491 Basal cell carcinoma of skin, unspecified: Secondary | ICD-10-CM

## 2019-10-02 NOTE — Patient Instructions (Signed)

## 2019-10-02 NOTE — Progress Notes (Signed)
Follow-Up Visit   Subjective  Chris Chung is a 69 y.o. adult who presents for the following: Procedure (here for treatment right upper back-superificial bcc).  BCC Location: Upper back Duration:  Quality:  Associated Signs/Symptoms: Modifying Factors:  Severity:  Timing: Context: Also concerned about nonhealing crust on forehead.  The following portions of the chart were reviewed this encounter and updated as appropriate: Tobacco  Allergies  Meds  Problems  Med Hx  Surg Hx  Fam Hx      Objective  Well appearing patient in no apparent distress; mood and affect are within normal limits.  All skin waist up examined.   Assessment & Plan  Basal cell carcinoma (BCC), unspecified site Right Upper Back  Destruction of lesion Complexity: simple   Destruction method: electrodesiccation and curettage   Informed consent: discussed and consent obtained   Timeout:  patient name, date of birth, surgical site, and procedure verified Anesthesia: the lesion was anesthetized in a standard fashion   Anesthetic:  1% lidocaine w/ epinephrine 1-100,000 local infiltration Curettage performed in three different directions: Yes   Electrodesiccation performed over the curetted area: Yes   Curettage cycles:  3 Lesion length (cm):  2 Lesion width (cm):  1 Margin per side (cm):  0 Final wound size (cm):  2 Hemostasis achieved with:  ferric subsulfate Outcome: patient tolerated procedure well with no complications   Post-procedure details: wound care instructions given   Additional details:  Inoculated with parenteral 5% fluorouracil  Neoplasm of uncertain behavior of skin Right Forehead  Skin / nail biopsy Type of biopsy: tangential   Informed consent: discussed and consent obtained   Timeout: patient name, date of birth, surgical site, and procedure verified   Procedure prep:  Patient was prepped and draped in usual sterile fashion Prep type:  Chlorhexidine Anesthesia: the  lesion was anesthetized in a standard fashion   Anesthetic:  1% lidocaine w/ epinephrine 1-100,000 local infiltration Instrument used: flexible razor blade   Hemostasis achieved with: ferric subsulfate   Outcome: patient tolerated procedure well   Post-procedure details: wound care instructions given    Specimen 1 - Surgical pathology Differential Diagnosis: scc vs bcc Check Margins: No  Sebaceous hyperplasia Mid Root of Nose  Although Chris Chung would prefer removal of these lesions, they are historically stable and for now such removal is not planned.  He may try topical pramoxine containing cream for the symptoms in the area. Several issues addressed today.  I reviewed the biopsies with Chris Chung and discussed the superficial basal cell which came back as expected on the right back shoulder.  This was treated with curette x3 and treatment of the base with fluorouracil.  He will have a dime sized abrasion there for 3 to 4 weeks with no restrictions other than avoidance of a sunburn.  He knows he can call me anytime if there are any issues.  The second issue was a spot on the right upper forehead which did not respond to freezing and now shows a new small pearly bump adjacent to a tiny erosion.  This was biopsied to rule out basal cell skin cancer and Chris Chung can check the result in 3 to 4 days on MyChart and call the office to discuss it.  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  There are 2 small lightly pigmented 2 mm papules which fit sebaceous hyperplasia (  versus scar tissue).  I do not think removing these papules will eliminate the itch.  We discussed topical pramoxine (he can Google search pramoxine and find multiple over-the-counter products which are safe to use as much as he wants on the area), injection of dilute triamcinolone 1-2 times into the area, and systemic neuroactive  agents like gabapentin.  He will think this over and will discuss how he wants to proceed at a follow-up conversation.  Initial follow-up by phone in 1 week after the biopsy on the right forehead is back.

## 2019-10-09 ENCOUNTER — Encounter (INDEPENDENT_AMBULATORY_CARE_PROVIDER_SITE_OTHER): Payer: Medicare Other | Admitting: Ophthalmology

## 2019-10-09 ENCOUNTER — Other Ambulatory Visit: Payer: Self-pay

## 2019-10-09 ENCOUNTER — Telehealth: Payer: Self-pay

## 2019-10-09 DIAGNOSIS — I10 Essential (primary) hypertension: Secondary | ICD-10-CM

## 2019-10-09 DIAGNOSIS — H43813 Vitreous degeneration, bilateral: Secondary | ICD-10-CM | POA: Diagnosis not present

## 2019-10-09 DIAGNOSIS — H34812 Central retinal vein occlusion, left eye, with macular edema: Secondary | ICD-10-CM

## 2019-10-09 DIAGNOSIS — H35033 Hypertensive retinopathy, bilateral: Secondary | ICD-10-CM | POA: Diagnosis not present

## 2019-10-09 NOTE — Telephone Encounter (Signed)
Phone call to patient with his pathology results. Patient aware of results.  

## 2019-10-09 NOTE — Telephone Encounter (Signed)
-----   Message from Lavonna Monarch, MD sent at 10/08/2019  6:42 AM EDT ----- Schedule surgery with Dr. Darene Lamer

## 2019-10-11 ENCOUNTER — Encounter: Payer: Self-pay | Admitting: Dermatology

## 2019-11-06 ENCOUNTER — Other Ambulatory Visit: Payer: Self-pay

## 2019-11-06 ENCOUNTER — Encounter (INDEPENDENT_AMBULATORY_CARE_PROVIDER_SITE_OTHER): Payer: Medicare Other | Admitting: Ophthalmology

## 2019-11-06 DIAGNOSIS — H34812 Central retinal vein occlusion, left eye, with macular edema: Secondary | ICD-10-CM | POA: Diagnosis not present

## 2019-11-06 DIAGNOSIS — H35033 Hypertensive retinopathy, bilateral: Secondary | ICD-10-CM | POA: Diagnosis not present

## 2019-11-06 DIAGNOSIS — I10 Essential (primary) hypertension: Secondary | ICD-10-CM

## 2019-11-06 DIAGNOSIS — H43813 Vitreous degeneration, bilateral: Secondary | ICD-10-CM

## 2019-11-12 ENCOUNTER — Encounter: Payer: Self-pay | Admitting: *Deleted

## 2019-11-20 ENCOUNTER — Encounter: Payer: Self-pay | Admitting: Dermatology

## 2019-11-20 ENCOUNTER — Other Ambulatory Visit: Payer: Self-pay

## 2019-11-20 ENCOUNTER — Ambulatory Visit (INDEPENDENT_AMBULATORY_CARE_PROVIDER_SITE_OTHER): Payer: Medicare Other | Admitting: Dermatology

## 2019-11-20 DIAGNOSIS — C44329 Squamous cell carcinoma of skin of other parts of face: Secondary | ICD-10-CM | POA: Diagnosis not present

## 2019-11-20 DIAGNOSIS — L988 Other specified disorders of the skin and subcutaneous tissue: Secondary | ICD-10-CM | POA: Diagnosis not present

## 2019-11-20 DIAGNOSIS — C4492 Squamous cell carcinoma of skin, unspecified: Secondary | ICD-10-CM

## 2019-11-20 NOTE — Progress Notes (Signed)
EXC RIGHT FOREHEAD 5.0 VIC X1 5.0 ETH X4  EXC SIZE 1.8 CM NURSE VISIT 7-10 DAYS MEDIAL MARGIN STAINED

## 2019-11-20 NOTE — Patient Instructions (Signed)

## 2019-11-27 ENCOUNTER — Other Ambulatory Visit: Payer: Self-pay

## 2019-11-27 ENCOUNTER — Ambulatory Visit (INDEPENDENT_AMBULATORY_CARE_PROVIDER_SITE_OTHER): Payer: Medicare Other | Admitting: *Deleted

## 2019-11-27 DIAGNOSIS — Z4802 Encounter for removal of sutures: Secondary | ICD-10-CM

## 2019-11-27 NOTE — Progress Notes (Signed)
Nurse to see for suture removal x 4: no signs or symptoms of infection, path to patient; told to recheck in 6 months.

## 2019-12-04 ENCOUNTER — Encounter (INDEPENDENT_AMBULATORY_CARE_PROVIDER_SITE_OTHER): Payer: Medicare Other | Admitting: Ophthalmology

## 2019-12-04 ENCOUNTER — Other Ambulatory Visit: Payer: Self-pay

## 2019-12-04 DIAGNOSIS — H34812 Central retinal vein occlusion, left eye, with macular edema: Secondary | ICD-10-CM | POA: Diagnosis not present

## 2019-12-04 DIAGNOSIS — H43813 Vitreous degeneration, bilateral: Secondary | ICD-10-CM | POA: Diagnosis not present

## 2019-12-04 DIAGNOSIS — H35033 Hypertensive retinopathy, bilateral: Secondary | ICD-10-CM

## 2019-12-04 DIAGNOSIS — I10 Essential (primary) hypertension: Secondary | ICD-10-CM | POA: Diagnosis not present

## 2019-12-16 ENCOUNTER — Encounter: Payer: Self-pay | Admitting: Dermatology

## 2019-12-16 NOTE — Progress Notes (Signed)
   Follow-Up Visit   Subjective  Chris Chung is a 69 y.o. adult who presents for the following: Procedure (right forehead).  SCCA Location:  Duration:  Quality:  Associated Signs/Symptoms: Modifying Factors:  Severity:  Timing: Context: For treatment  Objective  Well appearing patient in no apparent distress; mood and affect are within normal limits.  Focused examination of the head and neck.  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  Skin excision  Lesion length (cm):  1.2 Lesion width (cm):  1 Margin per side (cm):  0.1 Total excision diameter (cm):  1.4 Informed consent: discussed and consent obtained   Timeout: patient name, date of birth, surgical site, and procedure verified   Procedure prep:  Patient was prepped and draped in usual sterile fashion Prep type:  Chlorhexidine Anesthesia: the lesion was anesthetized in a standard fashion   Anesthetic:  1% lidocaine w/ epinephrine 1-100,000 local infiltration Instrument used: #15 blade   Hemostasis achieved with: suture   Outcome: patient tolerated procedure well with no complications   Post-procedure details: sterile dressing applied and wound care instructions given   Dressing type: petrolatum    Skin repair Complexity:  Intermediate Final length (cm):  1.5 Informed consent: discussed and consent obtained   Reason for type of repair: reduce tension to allow closure, reduce the risk of dehiscence, infection, and necrosis, reduce subcutaneous dead space and avoid a hematoma, preserve normal anatomical and functional relationships and enhance both functionality and cosmetic results   Undermining: edges undermined   Subcutaneous layers (deep stitches):  Suture size:  4-0 Suture type: Vicryl (polyglactin 910)   Fine/surface layer approximation (top stitches):  Suture size:  4-0 Suture type: nylon   Stitches: simple interrupted   Suture removal (days):  7 Hemostasis achieved with:  suture and pressure Post-procedure details: sterile dressing applied and wound care instructions given   Dressing type: bandage and petrolatum    Specimen 1 - Surgical pathology Differential Diagnosis: SCC Check Margins: YES [id number]  Initial curettage x3 showed a 1.2 cm defect with moderate depth, defect cauterized and recuretted then excised with narrow margins.  Layered closure.      I, Lavonna Monarch, MD, have reviewed all documentation for this visit.  The documentation on 12/16/19 for the exam, diagnosis, procedures, and orders are all accurate and complete.

## 2019-12-25 ENCOUNTER — Encounter (INDEPENDENT_AMBULATORY_CARE_PROVIDER_SITE_OTHER): Payer: Medicare Other | Admitting: Ophthalmology

## 2020-01-01 ENCOUNTER — Encounter (INDEPENDENT_AMBULATORY_CARE_PROVIDER_SITE_OTHER): Payer: Medicare Other | Admitting: Ophthalmology

## 2020-01-01 ENCOUNTER — Other Ambulatory Visit: Payer: Self-pay

## 2020-01-01 DIAGNOSIS — H34812 Central retinal vein occlusion, left eye, with macular edema: Secondary | ICD-10-CM

## 2020-01-01 DIAGNOSIS — H43813 Vitreous degeneration, bilateral: Secondary | ICD-10-CM

## 2020-01-01 DIAGNOSIS — H35033 Hypertensive retinopathy, bilateral: Secondary | ICD-10-CM | POA: Diagnosis not present

## 2020-01-01 DIAGNOSIS — I10 Essential (primary) hypertension: Secondary | ICD-10-CM

## 2020-01-12 ENCOUNTER — Other Ambulatory Visit: Payer: Self-pay | Admitting: Dermatology

## 2020-01-22 DIAGNOSIS — Z23 Encounter for immunization: Secondary | ICD-10-CM | POA: Diagnosis not present

## 2020-01-29 ENCOUNTER — Encounter (INDEPENDENT_AMBULATORY_CARE_PROVIDER_SITE_OTHER): Payer: Medicare Other | Admitting: Ophthalmology

## 2020-01-29 ENCOUNTER — Other Ambulatory Visit: Payer: Self-pay

## 2020-01-29 DIAGNOSIS — I10 Essential (primary) hypertension: Secondary | ICD-10-CM | POA: Diagnosis not present

## 2020-01-29 DIAGNOSIS — H43813 Vitreous degeneration, bilateral: Secondary | ICD-10-CM | POA: Diagnosis not present

## 2020-01-29 DIAGNOSIS — H34812 Central retinal vein occlusion, left eye, with macular edema: Secondary | ICD-10-CM | POA: Diagnosis not present

## 2020-01-29 DIAGNOSIS — H35032 Hypertensive retinopathy, left eye: Secondary | ICD-10-CM | POA: Diagnosis not present

## 2020-02-02 DIAGNOSIS — Z23 Encounter for immunization: Secondary | ICD-10-CM | POA: Diagnosis not present

## 2020-02-24 ENCOUNTER — Other Ambulatory Visit: Payer: Self-pay

## 2020-02-24 ENCOUNTER — Encounter (INDEPENDENT_AMBULATORY_CARE_PROVIDER_SITE_OTHER): Payer: Medicare Other | Admitting: Ophthalmology

## 2020-02-24 DIAGNOSIS — H43813 Vitreous degeneration, bilateral: Secondary | ICD-10-CM

## 2020-02-24 DIAGNOSIS — I10 Essential (primary) hypertension: Secondary | ICD-10-CM

## 2020-02-24 DIAGNOSIS — H35033 Hypertensive retinopathy, bilateral: Secondary | ICD-10-CM

## 2020-02-24 DIAGNOSIS — H34812 Central retinal vein occlusion, left eye, with macular edema: Secondary | ICD-10-CM | POA: Diagnosis not present

## 2020-03-15 ENCOUNTER — Other Ambulatory Visit: Payer: Self-pay | Admitting: Physician Assistant

## 2020-03-30 ENCOUNTER — Other Ambulatory Visit: Payer: Self-pay

## 2020-03-30 ENCOUNTER — Encounter (INDEPENDENT_AMBULATORY_CARE_PROVIDER_SITE_OTHER): Payer: Medicare Other | Admitting: Ophthalmology

## 2020-03-30 DIAGNOSIS — H43813 Vitreous degeneration, bilateral: Secondary | ICD-10-CM

## 2020-03-30 DIAGNOSIS — H34812 Central retinal vein occlusion, left eye, with macular edema: Secondary | ICD-10-CM | POA: Diagnosis not present

## 2020-03-30 DIAGNOSIS — H35032 Hypertensive retinopathy, left eye: Secondary | ICD-10-CM | POA: Diagnosis not present

## 2020-03-30 DIAGNOSIS — I10 Essential (primary) hypertension: Secondary | ICD-10-CM

## 2020-04-21 DIAGNOSIS — Z79899 Other long term (current) drug therapy: Secondary | ICD-10-CM | POA: Diagnosis not present

## 2020-04-21 DIAGNOSIS — I10 Essential (primary) hypertension: Secondary | ICD-10-CM | POA: Diagnosis not present

## 2020-04-21 DIAGNOSIS — I493 Ventricular premature depolarization: Secondary | ICD-10-CM | POA: Diagnosis not present

## 2020-04-21 DIAGNOSIS — E7800 Pure hypercholesterolemia, unspecified: Secondary | ICD-10-CM | POA: Diagnosis not present

## 2020-04-21 DIAGNOSIS — F419 Anxiety disorder, unspecified: Secondary | ICD-10-CM | POA: Diagnosis not present

## 2020-04-21 DIAGNOSIS — Z0001 Encounter for general adult medical examination with abnormal findings: Secondary | ICD-10-CM | POA: Diagnosis not present

## 2020-04-28 ENCOUNTER — Other Ambulatory Visit: Payer: Self-pay

## 2020-04-28 ENCOUNTER — Encounter (INDEPENDENT_AMBULATORY_CARE_PROVIDER_SITE_OTHER): Payer: Medicare Other | Admitting: Ophthalmology

## 2020-04-28 DIAGNOSIS — H43813 Vitreous degeneration, bilateral: Secondary | ICD-10-CM

## 2020-04-28 DIAGNOSIS — H2513 Age-related nuclear cataract, bilateral: Secondary | ICD-10-CM

## 2020-04-28 DIAGNOSIS — H34812 Central retinal vein occlusion, left eye, with macular edema: Secondary | ICD-10-CM | POA: Diagnosis not present

## 2020-04-28 DIAGNOSIS — H35033 Hypertensive retinopathy, bilateral: Secondary | ICD-10-CM

## 2020-04-28 DIAGNOSIS — H353111 Nonexudative age-related macular degeneration, right eye, early dry stage: Secondary | ICD-10-CM | POA: Diagnosis not present

## 2020-04-28 DIAGNOSIS — I10 Essential (primary) hypertension: Secondary | ICD-10-CM

## 2020-05-15 ENCOUNTER — Other Ambulatory Visit: Payer: Self-pay | Admitting: Cardiology

## 2020-05-18 NOTE — Progress Notes (Deleted)
Cardiology Office Note   Date:  05/18/2020   ID:  Chris Chung, DOB Nov 06, 1950, MRN [id number]  PCP:  [name], MD  Cardiologist: Dr. [name], MD   No chief complaint on file.     History of Present Illness: Chris Chung is a 70 y.o. adult who presents for follow up, seen for Dr. [name].   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 He wore a 48-holter monitor in 2016 that showed a short run atrial tachycardia lasting 3-4 seconds with a mean HR in the 70-80 range treated with metoprolol. Echocardiogram showed normal LVEF with mild upper septal hypertrophy with impaired relaxation. Coronary CTA performed  01/2016 with a calcium score 52-40th percentile with mild non-obstructive diffuse CAD. He has a family hx of CAD in his brother at 43yo with CABG   He was most recently 05/14/2019 in follow up and was doing well from a CV standpoint. He was exercising regularly with no issues.   1. Non-obstructive CAD: -Coronary CTA 01/2016 with no    2. Palpitations: -Prior monitor with  -Denies palpitations  -Continue metoprolol  3. HLD: -Last LDL 75 04/23/2019 -Continue rosuvastatin   Past Medical History:  Diagnosis Date  . Abnormal computed tomography angiography (CTA) 01/21/2016  . Anxiety   . Atypical mole 12/18/1996   mid right back (slight) no tx  . Atypical mole 04/01/2002   left back (slight) no tx  . Atypical mole 11/17/2003   under right axilla (slight) no tx  . Basal cell carcinoma 08/10/2005   front center scalp superior (mohs)  . BCC (basal cell carcinoma of skin) 11/04/2018   distal left forearm (cx76fu)  . BCC (basal cell carcinoma) 08/10/2005   front center scalp inferior (mohs)  . BCC (basal cell carcinoma) 03/10/2013   right sholder ( cx78fu)  . BCC (basal cell carcinoma) 10/25/2015   right scalp( cx72fu)  . BCC (basal cell carcinoma) 02/08/1989   left back inferior   . BCC (basal cell  carcinoma) 02/08/1989   left back superior medial   . BCC (basal cell carcinoma) 03/01/1992   mid sternum (cx41fu)  . BCC (basal cell carcinoma) 09/17/1992   right outer brow (excision)  . BCC (basal cell carcinoma) 10/31/1994   left sholder (cx36fu)  . BCC (basal cell carcinoma) 11/03/1997   proximal left forearm (cx21fu)  . BCC (basal cell carcinoma) 11/03/1997   left scalp temple hairline superior (cx60fu)  . BCC (basal cell carcinoma) 11/03/1997   left scalp temple hairline inferior (cx67fu)  . BCC (basal cell carcinoma) 03/26/1998   top left sholder (cx3 excision)  . BCC (basal cell carcinoma) 08/19/1998   top front scalp (cx58fu)  . BCC (basal cell carcinoma) 03/12/2000   top scalp front (cx3 )  . BCC (basal cell carcinoma) 04/01/2002   top front scalp (cx3 excision)  . BCC (basal cell carcinoma) 07/08/2002   top right front scalp (excision)  . BCC (basal cell carcinoma) 05/26/2003   right front scalp (cx3 excision)  . BCC (basal cell carcinoma) 11/17/2003   front center scalp superior (cx86fu)  . Elevated PSA   . History of skin cancer   . Hyperlipidemia 01/21/2016  . Insomnia   . Palpitations   . SCC (squamous cell carcinoma) 02/21/2010   left scalp (cx20fu)  . SCC (squamous cell carcinoma) 10/02/2019   RIGHT FOREHEAD EXC  . Squamous cell carcinoma of skin 11/05/2018  right forehead (txpbx)  . 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 Vertigo 07/04/2016    Past Surgical History:  Procedure Laterality Date  . PROSTATE BIOPSY    . SKIN CANCER EXCISION    . TONSILLECTOMY       Current Outpatient Medications  Medication Sig Dispense Refill  . BESIVANCE 0.6 % SUSP     . cholecalciferol (VITAMIN D) 1000 units tablet Take 1,000 Units by mouth daily.    . metoprolol tartrate (LOPRESSOR) 25 MG tablet TAKE 1/2 TABLET BY MOUTH TWICE DAILY 90 tablet 3  . minocycline (MINOCIN) 50 MG capsule Take 1 capsule (50 mg total) by mouth 2 (two) times daily. 60 capsule 3  .  rosuvastatin (CRESTOR) 5 MG tablet TAKE 1 TABLET BY MOUTH 5 TIMES A WEEK 20 tablet 3  . valACYclovir (VALTREX) 500 MG tablet TAKE 1 TABLET(500 MG) BY MOUTH TWICE DAILY 60 tablet 5   No current facility-administered medications for this visit.    Allergies:   Patient has no known allergies.    Social History:  The patient  reports that he has never smoked. He has never used smokeless tobacco. He reports that he does not drink alcohol and does not use drugs.   Family History:  The patient's ***family history is not on file.    ROS:  Please see the history of present illness.   Otherwise, review of systems are positive for {NONE DEFAULTED:18576::"none"}.   All other systems are reviewed and negative.    PHYSICAL EXAM: VS:  There were no vitals taken for this visit. , BMI There is no height or weight on file to calculate BMI. GEN: Well nourished, well developed, in no acute distress HEENT: normal Neck: no JVD, carotid bruits, or masses Cardiac: ***RRR; no murmurs, rubs, or gallops,no edema  Respiratory:  clear to auscultation bilaterally, normal work of breathing GI: soft, nontender, nondistended, + BS MS: no deformity or atrophy Skin: warm and dry, no rash Neuro:  Strength and sensation are intact Psych: euthymic mood, full affect   EKG:  EKG {ACTION; IS/IS MHW:[id number]} ordered today. The ekg ordered today demonstrates ***   Recent Labs: No results found for requested labs within last 8760 hours.    Lipid Panel    Component Value Date/Time   CHOL 134 08/06/2019 0734   TRIG 124 08/06/2019 0734   HDL 52 08/06/2019 0734   CHOLHDL 2.6 08/06/2019 0734   CHOLHDL 3.5 01/25/2016 0756   VLDL 25 01/25/2016 0756   LDLCALC 60 08/06/2019 0734     Wt Readings from Last 3 Encounters:  05/14/19 165 lb 9.6 oz (75.1 kg)  05/15/18 172 lb 12.8 oz (78.4 kg)  04/23/17 170 lb (77.1 kg)     Other studies Reviewed: Additional studies/ records that were reviewed today include:  ***. Review of the above records demonstrates: ***  Coronary CTA 9/2017IMPRESSION: 1. Coronary calcium score of 52. This was 40 percentile for age and sex matched control.  2. Normal coronary origin with right dominance.  3. Mild diffuse non-obstructive CAD, an aggressive risk factor modification is recommended.   ASSESSMENT AND PLAN:  1.  ***   Current medicines are reviewed at length with the patient today.  The patient {ACTIONS; HAS/DOES NOT HAVE:19233} concerns regarding medicines.  The following changes have been made:  {PLAN; NO CHANGE:13088:s}  Labs/ tests ordered today include: *** No orders of the defined types were placed in this encounter.    Disposition:   FU with *** in {gen number  2-94:[id number]} {Days to years:10300}  Signed, Kathyrn Drown, NP  05/18/2020 2:53 PM    [facility] [address], [place], [place]  [address] Phone: [phone]; Fax: [phone]

## 2020-05-26 ENCOUNTER — Telehealth (INDEPENDENT_AMBULATORY_CARE_PROVIDER_SITE_OTHER): Payer: Medicare Other | Admitting: Cardiology

## 2020-05-26 ENCOUNTER — Ambulatory Visit: Payer: Medicare Other | Admitting: Cardiovascular Disease

## 2020-05-26 ENCOUNTER — Encounter: Payer: Self-pay | Admitting: Cardiology

## 2020-05-26 ENCOUNTER — Ambulatory Visit: Payer: Medicare Other | Admitting: Cardiology

## 2020-05-26 VITALS — BP 123/66 | Ht 69.0 in | Wt 165.0 lb

## 2020-05-26 DIAGNOSIS — R002 Palpitations: Secondary | ICD-10-CM

## 2020-05-26 DIAGNOSIS — E785 Hyperlipidemia, unspecified: Secondary | ICD-10-CM

## 2020-05-26 DIAGNOSIS — R9389 Abnormal findings on diagnostic imaging of other specified body structures: Secondary | ICD-10-CM

## 2020-05-26 NOTE — Patient Instructions (Signed)
Medication Instructions:  Your physician recommends that you continue on your current medications as directed. Please refer to the Current Medication list given to you today.  *If you need a refill on your cardiac medications before your next appointment, please call your pharmacy*   Lab Work: NONE If you have labs (blood work) drawn today and your tests are completely normal, you will receive your results only by: Marland Kitchen MyChart Message (if you have MyChart) OR . A paper copy in the mail If you have any lab test that is abnormal or we need to change your treatment, we will call you to review the results.   Testing/Procedures: NONE   Follow-Up: At [facility], you and your health needs are our priority.  As part of our continuing mission to provide you with exceptional heart care, we have created designated Provider Care Teams.  These Care Teams include your primary Cardiologist (physician) and Advanced Practice Providers (APPs -  Physician Assistants and Nurse Practitioners) who all work together to provide you with the care you need, when you need it.  We recommend signing up for the patient portal called "MyChart".  Sign up information is provided on this After Visit Summary.  MyChart is used to connect with patients for Virtual Visits (Telemedicine).  Patients are able to view lab/test results, encounter notes, upcoming appointments, etc.  Non-urgent messages can be sent to your provider as well.   To learn more about what you can do with MyChart, go to [email or web address].    Your next appointment:   12 month(s)  The format for your next appointment:   In Person  Provider:   You may see Ena Dawley, MD or one of the following Advanced Practice Providers on your designated Care Team:    Melina Copa, PA-C  Ermalinda Barrios, PA-C

## 2020-05-26 NOTE — Progress Notes (Signed)
Virtual Visit via Video Note   This visit type was conducted due to national recommendations for restrictions regarding the COVID-19 Pandemic (e.g. social distancing) in an effort to limit this patient's exposure and mitigate transmission in our community.  Due to his co-morbid illnesses, this patient is at least at moderate risk for complications without adequate follow up.  This format is felt to be most appropriate for this patient at this time.  All issues noted in this document were discussed and addressed.  A limited physical exam was performed with this format.  Please refer to the patient's chart for his consent to telehealth for [facility].    Date:  05/26/2020   ID:  Chris Chung, DOB 1951-01-23, MRN [id number] The patient was identified using 2 identifiers.  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 He wore a 48-holter monitor in 2016 that showed a short run atrial tachycardia lasting 3-4 seconds with a mean HR in the 70-80 range treated with metoprolol. Echocardiogram showed normal LVEF with mild upper septal hypertrophy with impaired relaxation. Coronary CTA performed 01/2016 showed a calcium score 52 with mild non-obstructive diffuse CAD. He has a family hx of CAD in his brother at 59yo with CABG.  He was most recently seen 05/14/2019 in follow up and was doing well from a CV standpoint. He was exercising regularly with no issues.   Today I am seeing Chris Chung fr follow up. He has been doing very well with no recent c/o of palpitations. He states that he continues to exercise regularly 4-5 times per week. He is staying busy with volunteering. He states that  he has been dealing with retinal venous occlusion for which he follows with his opthamologist every 4 weeks for eye injections. He was recently see for full wellness exam and lab work 04/2020. He will have their office fax [place] his labs soon.    The patient does not have symptoms concerning for COVID-19 infection (fever, chills, cough, or new shortness of breath).   Past Medical History:  Diagnosis Date  . Abnormal computed tomography angiography (CTA) 01/21/2016  . Anxiety   . Atypical mole 12/18/1996   mid right back (slight) no tx  . Atypical mole 04/01/2002   left back (slight) no tx  . Atypical mole 11/17/2003   under right axilla (slight) no tx  . Basal cell carcinoma 08/10/2005   front center scalp superior (mohs)  . BCC (basal cell carcinoma of skin) 11/04/2018   distal left forearm (cx21fu)  . BCC (basal cell carcinoma) 08/10/2005   front center scalp inferior (mohs)  . BCC (basal cell carcinoma) 03/10/2013   right sholder ( cx40fu)  . BCC (basal cell carcinoma) 10/25/2015   right scalp( cx91fu)  . BCC (basal cell carcinoma) 02/08/1989   left back inferior   . BCC (basal cell carcinoma) 02/08/1989   left back superior medial   . BCC (basal cell carcinoma) 03/01/1992   mid sternum (cx41fu)  . BCC (basal cell carcinoma) 09/17/1992   right outer brow (excision)  . BCC (basal cell carcinoma) 10/31/1994   left sholder (cx60fu)  . BCC (basal cell carcinoma) 11/03/1997   proximal  left forearm (cx55fu)  . BCC (basal cell carcinoma) 11/03/1997   left scalp temple hairline superior (cx29fu)  . BCC (basal cell carcinoma) 11/03/1997   left scalp temple hairline inferior (cx61fu)  . BCC (basal cell carcinoma) 03/26/1998   top left sholder (cx3 excision)  . BCC (basal cell carcinoma) 08/19/1998   top front scalp (cx52fu)  . BCC (basal cell carcinoma) 03/12/2000   top scalp front (cx3 )  . BCC (basal cell carcinoma) 04/01/2002   top front scalp (cx3 excision)  . BCC (basal  cell carcinoma) 07/08/2002   top right front scalp (excision)  . BCC (basal cell carcinoma) 05/26/2003   right front scalp (cx3 excision)  . BCC (basal cell carcinoma) 11/17/2003   front center scalp superior (cx29fu)  . Elevated PSA   . History of skin cancer   . Hyperlipidemia 01/21/2016  . Insomnia   . Palpitations   . SCC (squamous cell carcinoma) 02/21/2010   left scalp (cx44fu)  . SCC (squamous cell carcinoma) 10/02/2019   RIGHT FOREHEAD EXC  . Squamous cell carcinoma of skin 11/05/2018   right forehead (txpbx)  . 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 Vertigo 07/04/2016   Past Surgical History:  Procedure Laterality Date  . PROSTATE BIOPSY    . SKIN CANCER EXCISION    . TONSILLECTOMY       Current Meds  Medication Sig  . BESIVANCE 0.6 % SUSP   . cholecalciferol (VITAMIN D) 1000 units tablet Take 1,000 Units by mouth daily.  . metoprolol tartrate (LOPRESSOR) 25 MG tablet TAKE 1/2 TABLET BY MOUTH TWICE DAILY  . minocycline (MINOCIN) 50 MG capsule Take 1 capsule (50 mg total) by mouth 2 (two) times daily.  . rosuvastatin (CRESTOR) 5 MG tablet TAKE 1 TABLET BY MOUTH 5 TIMES A WEEK  . valACYclovir (VALTREX) 500 MG tablet TAKE 1 TABLET(500 MG) BY MOUTH TWICE DAILY     Allergies:   Patient has no known allergies.   Social History   Tobacco Use  . Smoking status: Never Smoker  . Smokeless tobacco: Never Used  Vaping Use  . Vaping Use: Never used  Substance Use Topics  . Alcohol use: No  . Drug use: No     Family Hx: The patient's family history is not on file.  ROS:   Please see the history of present illness.     All other systems reviewed and are negative.  Prior CV studies:   The following studies were reviewed today:  Coronary CTA 01/2016:  1. Coronary calcium score of 52. This was 26 percentile for age and sex matched control.  2. Normal coronary origin with right dominance.  3. Mild diffuse non-obstructive CAD, an aggressive risk factor modification  is recommended.   Labs/Other Tests and Data Reviewed:    EKG:  An ECG dated 05/14/19 was personally reviewed today and demonstrated:  NSR  Recent Labs: No results found for requested labs within last 8760 hours.   Recent Lipid Panel Lab Results  Component Value Date/Time   CHOL 134 08/06/2019 07:34 AM   TRIG 124 08/06/2019 07:34 AM   HDL 52 08/06/2019 07:34 AM   CHOLHDL 2.6 08/06/2019 07:34 AM   CHOLHDL 3.5 01/25/2016 07:56 AM   LDLCALC 60 08/06/2019 07:34 AM    Wt Readings from Last 3 Encounters:  05/26/20 165 lb (74.8 kg)  05/14/19 165 lb 9.6 oz (75.1 kg)  05/15/18 172 lb 12.8 oz (78.4 kg)    Objective:    Vital Signs:  BP 123/66   Ht 5\' 9"  (1.753 m)   Wt 165 lb (74.8 kg)   BMI 24.37 kg/m    VITAL SIGNS:  reviewed GEN:  no acute distress NEURO:  alert and oriented x 3, no obvious focal deficit PSYCH:  normal affect  ASSESSMENT & PLAN:    1. Non-obstructive CAD: -Coronary CTA 01/2016 with no obstructive disease.  -No c/o of anginal symptoms -Continue with risk modifications    2. Palpitations: -Prior monitor with short runs of atrial tachycardia but an AF. -Denies recent palpitations  -Continue metoprolol  3. HLD: -Last LDL 75 04/23/2019 -Continue rosuvastatin  -Last LDL per patient report, 57 on 04/2020 with PCP -To be faxed to our office for review    COVID-19 Education: The signs and symptoms of COVID-19 were discussed with the patient and how to seek care for testing (follow up with PCP or arrange E-visit).  The importance of social distancing was discussed today.  Time:   Today, I have spent 20 minutes with the patient with telehealth technology discussing the above problems.   Medication Adjustments/Labs and Tests Ordered: Current medicines are reviewed at length with the patient today.  Concerns regarding medicines are outlined above.   Tests Ordered: No orders of the defined types were placed in this encounter.   Medication Changes: No  orders of the defined types were placed in this encounter.   Follow Up:  In Person Dr. [name] in 1 year  Signed, Kathyrn Drown, NP  05/26/2020 10:13 AM    [place]

## 2020-05-27 ENCOUNTER — Other Ambulatory Visit: Payer: Self-pay

## 2020-05-27 ENCOUNTER — Encounter (INDEPENDENT_AMBULATORY_CARE_PROVIDER_SITE_OTHER): Payer: Medicare Other | Admitting: Ophthalmology

## 2020-05-27 DIAGNOSIS — H34812 Central retinal vein occlusion, left eye, with macular edema: Secondary | ICD-10-CM | POA: Diagnosis not present

## 2020-05-27 DIAGNOSIS — H43813 Vitreous degeneration, bilateral: Secondary | ICD-10-CM

## 2020-05-27 DIAGNOSIS — H35032 Hypertensive retinopathy, left eye: Secondary | ICD-10-CM

## 2020-05-27 DIAGNOSIS — I10 Essential (primary) hypertension: Secondary | ICD-10-CM | POA: Diagnosis not present

## 2020-06-03 ENCOUNTER — Other Ambulatory Visit: Payer: Self-pay | Admitting: Physician Assistant

## 2020-06-15 ENCOUNTER — Encounter: Payer: Self-pay | Admitting: Dermatology

## 2020-06-15 ENCOUNTER — Other Ambulatory Visit: Payer: Self-pay

## 2020-06-15 ENCOUNTER — Ambulatory Visit (INDEPENDENT_AMBULATORY_CARE_PROVIDER_SITE_OTHER): Payer: Medicare Other | Admitting: Dermatology

## 2020-06-15 DIAGNOSIS — C4441 Basal cell carcinoma of skin of scalp and neck: Secondary | ICD-10-CM | POA: Diagnosis not present

## 2020-06-15 DIAGNOSIS — D485 Neoplasm of uncertain behavior of skin: Secondary | ICD-10-CM

## 2020-06-15 DIAGNOSIS — L570 Actinic keratosis: Secondary | ICD-10-CM

## 2020-06-15 DIAGNOSIS — C4491 Basal cell carcinoma of skin, unspecified: Secondary | ICD-10-CM

## 2020-06-15 DIAGNOSIS — C44319 Basal cell carcinoma of skin of other parts of face: Secondary | ICD-10-CM | POA: Diagnosis not present

## 2020-06-15 HISTORY — DX: Basal cell carcinoma of skin, unspecified: C44.91

## 2020-06-15 NOTE — Patient Instructions (Signed)

## 2020-06-23 ENCOUNTER — Telehealth: Payer: Self-pay

## 2020-06-23 NOTE — Telephone Encounter (Signed)
Release:  [id number]   PATH TO PATIENT HE IS AWARE OF MOHS

## 2020-06-23 NOTE — Telephone Encounter (Signed)
Left message to call patient needs mohs 

## 2020-06-23 NOTE — Telephone Encounter (Signed)
-----   Message from Lavonna Monarch, MD sent at 06/23/2020  6:45 AM EST ----- Mohs surgery for left eyebrow, 30 minutes with me for left scalp

## 2020-06-24 ENCOUNTER — Other Ambulatory Visit: Payer: Self-pay

## 2020-06-24 ENCOUNTER — Encounter (INDEPENDENT_AMBULATORY_CARE_PROVIDER_SITE_OTHER): Payer: Medicare Other | Admitting: Ophthalmology

## 2020-06-24 DIAGNOSIS — H35032 Hypertensive retinopathy, left eye: Secondary | ICD-10-CM

## 2020-06-24 DIAGNOSIS — I10 Essential (primary) hypertension: Secondary | ICD-10-CM | POA: Diagnosis not present

## 2020-06-24 DIAGNOSIS — H43813 Vitreous degeneration, bilateral: Secondary | ICD-10-CM | POA: Diagnosis not present

## 2020-06-24 DIAGNOSIS — H34812 Central retinal vein occlusion, left eye, with macular edema: Secondary | ICD-10-CM | POA: Diagnosis not present

## 2020-06-25 ENCOUNTER — Encounter: Payer: Self-pay | Admitting: Dermatology

## 2020-06-25 NOTE — Progress Notes (Signed)
   Follow-Up Visit   Subjective  Chris Chung is a 70 y.o. adult who presents for the following: Skin Problem (Patient here today for new spot on right temple x 6 weeks. Per patient no bleeding, crusty, and sore.).  For several new spots on face and scalp Location:  Duration:  Quality:  Associated Signs/Symptoms: Modifying Factors:  Severity:  Timing: Context: History of multiple skin cancers.  Objective  Well appearing patient in no apparent distress; mood and affect are within normal limits. Objective  Left Supraorbital Region: 7 mm centrally eroded pearly papule     Objective  Right Temple: Crusted 6 mm pearly papule     Objective  Mid Parietal Scalp: Waxy 7 mm crust       All skin waist up examined.   Assessment & Plan    Neoplasm of uncertain behavior of skin (3) Left Supraorbital Region  Skin / nail biopsy Type of biopsy: tangential   Informed consent: discussed and consent obtained   Timeout: patient name, date of birth, surgical site, and procedure verified   Procedure prep:  Patient was prepped and draped in usual sterile fashion (Non sterile) Prep type:  Chlorhexidine Anesthesia: the lesion was anesthetized in a standard fashion   Anesthetic:  1% lidocaine w/ epinephrine 1-100,000 local infiltration Instrument used: flexible razor blade   Outcome: patient tolerated procedure well   Post-procedure details: wound care instructions given    Specimen 1 - Surgical pathology Differential Diagnosis: BCC SCC  Check Margins: No  Right Temple  Skin / nail biopsy Type of biopsy: tangential   Informed consent: discussed and consent obtained   Timeout: patient name, date of birth, surgical site, and procedure verified   Procedure prep:  Patient was prepped and draped in usual sterile fashion (Non sterile) Prep type:  Chlorhexidine Anesthesia: the lesion was anesthetized in a standard fashion   Anesthetic:  1% lidocaine w/ epinephrine 1-100,000  local infiltration Instrument used: flexible razor blade   Outcome: patient tolerated procedure well   Post-procedure details: wound care instructions given    Specimen 2 - Surgical pathology Differential Diagnosis: BCC SCC  Check Margins: No  Mid Parietal Scalp  Skin / nail biopsy Type of biopsy: tangential   Informed consent: discussed and consent obtained   Timeout: patient name, date of birth, surgical site, and procedure verified   Procedure prep:  Patient was prepped and draped in usual sterile fashion (Non sterile) Prep type:  Chlorhexidine Anesthesia: the lesion was anesthetized in a standard fashion   Anesthetic:  1% lidocaine w/ epinephrine 1-100,000 local infiltration Instrument used: flexible razor blade   Outcome: patient tolerated procedure well   Post-procedure details: wound care instructions given    Specimen 3 - Surgical pathology Differential Diagnosis: BCC SCC  Check Margins: No  Mohs for left eyelid lesion if cancer.      I, Lavonna Monarch, MD, have reviewed all documentation for this visit.  The documentation on 06/25/20 for the exam, diagnosis, procedures, and orders are all accurate and complete.

## 2020-07-05 ENCOUNTER — Other Ambulatory Visit: Payer: Self-pay

## 2020-07-05 ENCOUNTER — Encounter: Payer: Self-pay | Admitting: Dermatology

## 2020-07-05 ENCOUNTER — Other Ambulatory Visit: Payer: Self-pay | Admitting: Dermatology

## 2020-07-05 ENCOUNTER — Ambulatory Visit (INDEPENDENT_AMBULATORY_CARE_PROVIDER_SITE_OTHER): Payer: Medicare Other | Admitting: Dermatology

## 2020-07-05 DIAGNOSIS — L089 Local infection of the skin and subcutaneous tissue, unspecified: Secondary | ICD-10-CM | POA: Diagnosis not present

## 2020-07-05 MED ORDER — MUPIROCIN 2 % EX OINT: 1.0000 "application " | TOPICAL_OINTMENT | Freq: Two times a day (BID) | CUTANEOUS | 0 refills | Status: DC

## 2020-07-05 NOTE — Progress Notes (Signed)
bcst

## 2020-07-11 LAB — ANAEROBIC AND AEROBIC CULTURE
MICRO NUMBER:: 11586424
MICRO NUMBER:: 11586425
SPECIMEN QUALITY:: ADEQUATE
SPECIMEN QUALITY:: ADEQUATE

## 2020-07-14 ENCOUNTER — Encounter: Payer: Self-pay | Admitting: Dermatology

## 2020-07-14 NOTE — Progress Notes (Signed)
   Follow-Up Visit   Subjective  Chris Chung is a 70 y.o. adult who presents for the following: Follow-up (Per patient inner eye bcc May surgery, wamts Dr Denna Haggard to seen).  Check biopsy site Location:  Duration:  Quality: Crusted and itches Associated Signs/Symptoms: Modifying Factors:  Severity:  Timing: Context:   Objective  Well appearing patient in no apparent distress; mood and affect are within normal limits. Objective  Left Supraorbital Region: Impetiginized crust at site of biopsy    A focused examination was performed including Face. Relevant physical exam findings are noted in the Assessment and Plan.   Assessment & Plan    Local infection of skin and subcutaneous tissue Left Supraorbital Region  Bacterial culture obtained, will use twice daily mupirocin pending this result.  mupirocin ointment (BACTROBAN) 2 % - Left Supraorbital Region  Other Related Procedures Anaerobic and Aerobic Culture      I, Lavonna Monarch, MD, have reviewed all documentation for this visit.  The documentation on 07/14/20 for the exam, diagnosis, procedures, and orders are all accurate and complete.

## 2020-07-29 ENCOUNTER — Encounter (INDEPENDENT_AMBULATORY_CARE_PROVIDER_SITE_OTHER): Payer: Medicare Other | Admitting: Ophthalmology

## 2020-07-29 ENCOUNTER — Other Ambulatory Visit: Payer: Self-pay

## 2020-07-29 DIAGNOSIS — H34812 Central retinal vein occlusion, left eye, with macular edema: Secondary | ICD-10-CM

## 2020-07-29 DIAGNOSIS — H43813 Vitreous degeneration, bilateral: Secondary | ICD-10-CM | POA: Diagnosis not present

## 2020-07-29 DIAGNOSIS — I10 Essential (primary) hypertension: Secondary | ICD-10-CM | POA: Diagnosis not present

## 2020-07-29 DIAGNOSIS — H2513 Age-related nuclear cataract, bilateral: Secondary | ICD-10-CM | POA: Diagnosis not present

## 2020-07-29 DIAGNOSIS — H35032 Hypertensive retinopathy, left eye: Secondary | ICD-10-CM

## 2020-08-19 NOTE — Addendum Note (Signed)
Addended by: Lavonna Monarch on: 08/19/2020 07:08 AM   Modules accepted: Level of Service

## 2020-08-31 ENCOUNTER — Other Ambulatory Visit: Payer: Self-pay | Admitting: Cardiology

## 2020-09-02 ENCOUNTER — Other Ambulatory Visit: Payer: Self-pay

## 2020-09-02 ENCOUNTER — Encounter (INDEPENDENT_AMBULATORY_CARE_PROVIDER_SITE_OTHER): Payer: Medicare Other | Admitting: Ophthalmology

## 2020-09-02 DIAGNOSIS — H43813 Vitreous degeneration, bilateral: Secondary | ICD-10-CM

## 2020-09-02 DIAGNOSIS — H35033 Hypertensive retinopathy, bilateral: Secondary | ICD-10-CM | POA: Diagnosis not present

## 2020-09-02 DIAGNOSIS — H34812 Central retinal vein occlusion, left eye, with macular edema: Secondary | ICD-10-CM

## 2020-09-02 DIAGNOSIS — I10 Essential (primary) hypertension: Secondary | ICD-10-CM | POA: Diagnosis not present

## 2020-09-15 DIAGNOSIS — C441191 Basal cell carcinoma of skin of left upper eyelid, including canthus: Secondary | ICD-10-CM | POA: Diagnosis not present

## 2020-10-07 ENCOUNTER — Encounter (INDEPENDENT_AMBULATORY_CARE_PROVIDER_SITE_OTHER): Payer: Medicare Other | Admitting: Ophthalmology

## 2020-10-07 ENCOUNTER — Other Ambulatory Visit: Payer: Self-pay

## 2020-10-07 DIAGNOSIS — H35032 Hypertensive retinopathy, left eye: Secondary | ICD-10-CM

## 2020-10-07 DIAGNOSIS — I10 Essential (primary) hypertension: Secondary | ICD-10-CM | POA: Diagnosis not present

## 2020-10-07 DIAGNOSIS — H353111 Nonexudative age-related macular degeneration, right eye, early dry stage: Secondary | ICD-10-CM

## 2020-10-07 DIAGNOSIS — H43813 Vitreous degeneration, bilateral: Secondary | ICD-10-CM | POA: Diagnosis not present

## 2020-10-07 DIAGNOSIS — H34812 Central retinal vein occlusion, left eye, with macular edema: Secondary | ICD-10-CM

## 2020-10-14 ENCOUNTER — Encounter: Payer: Medicare Other | Admitting: Dermatology

## 2020-10-21 ENCOUNTER — Other Ambulatory Visit: Payer: Self-pay

## 2020-10-21 ENCOUNTER — Ambulatory Visit (INDEPENDENT_AMBULATORY_CARE_PROVIDER_SITE_OTHER): Payer: Medicare Other | Admitting: Dermatology

## 2020-10-21 ENCOUNTER — Encounter: Payer: Self-pay | Admitting: Dermatology

## 2020-10-21 DIAGNOSIS — L84 Corns and callosities: Secondary | ICD-10-CM | POA: Diagnosis not present

## 2020-10-21 DIAGNOSIS — L570 Actinic keratosis: Secondary | ICD-10-CM

## 2020-10-21 DIAGNOSIS — C4491 Basal cell carcinoma of skin, unspecified: Secondary | ICD-10-CM

## 2020-10-21 DIAGNOSIS — C4441 Basal cell carcinoma of skin of scalp and neck: Secondary | ICD-10-CM | POA: Diagnosis not present

## 2020-10-21 DIAGNOSIS — Z85828 Personal history of other malignant neoplasm of skin: Secondary | ICD-10-CM

## 2020-10-21 NOTE — Patient Instructions (Signed)

## 2020-10-29 ENCOUNTER — Encounter: Payer: Self-pay | Admitting: Dermatology

## 2020-10-29 NOTE — Progress Notes (Signed)
   Follow-Up Visit   Subjective  Chris Chung is a 70 y.o. adult who presents for the following: Procedure (Patient here today for BCC x 1 mid parietal scalp).  BCC scalp, also painful spot on foot and crusts on face and scalp Location:  Duration:  Quality:  Associated Signs/Symptoms: Modifying Factors:  Severity:  Timing: Context:   Objective  Well appearing patient in no apparent distress; mood and affect are within normal limits. Mid Parietal Scalp Lesion identified by Dr.Aditri Louischarles and nurse in room.    Left Nasal Sidewall, Mid Frontal Scalp (6) Half dozen plus gritty plus hornlike 2 to 4 mm pink crusts  Left Middle Plantar Surface Painful hyperkeratosis, more likely a hard corn millimeters plantar wart.    A focused examination was performed including feet, head, neck.. Relevant physical exam findings are noted in the Assessment and Plan.   Assessment & Plan    Basal cell carcinoma (BCC), unspecified site Mid Parietal Scalp  Destruction of lesion Complexity: simple   Destruction method: electrodesiccation and curettage   Informed consent: discussed and consent obtained   Timeout:  patient name, date of birth, surgical site, and procedure verified Anesthesia: the lesion was anesthetized in a standard fashion   Anesthetic:  1% lidocaine w/ epinephrine 1-100,000 local infiltration Curettage performed in three different directions: Yes   Curettage cycles:  3 Lesion length (cm):  2 Lesion width (cm):  2 Margin per side (cm):  0 Final wound size (cm):  2 Hemostasis achieved with:  ferric subsulfate Outcome: patient tolerated procedure well with no complications   Additional details:  Wound innoculated with 5 fluorouracil solution.  AK (actinic keratosis) (7) Mid Frontal Scalp (6); Left Nasal Sidewall  Destruction of lesion - Left Nasal Sidewall, Mid Frontal Scalp Complexity: simple   Destruction method: cryotherapy   Informed consent: discussed and consent  obtained   Timeout:  patient name, date of birth, surgical site, and procedure verified Lesion destroyed using liquid nitrogen: Yes   Cryotherapy cycles:  3 Outcome: patient tolerated procedure well with no complications    Corns and callosities Left Middle Plantar Surface  .  Pared with 15 scalpel blade and dichloroacetic acid applied.      I, Lavonna Monarch, MD, have reviewed all documentation for this visit.  The documentation on 10/29/20 for the exam, diagnosis, procedures, and orders are all accurate and complete.

## 2020-11-11 ENCOUNTER — Other Ambulatory Visit: Payer: Self-pay

## 2020-11-11 ENCOUNTER — Encounter (INDEPENDENT_AMBULATORY_CARE_PROVIDER_SITE_OTHER): Payer: Medicare Other | Admitting: Ophthalmology

## 2020-11-11 DIAGNOSIS — H34812 Central retinal vein occlusion, left eye, with macular edema: Secondary | ICD-10-CM

## 2020-11-11 DIAGNOSIS — H43813 Vitreous degeneration, bilateral: Secondary | ICD-10-CM

## 2020-11-11 DIAGNOSIS — I10 Essential (primary) hypertension: Secondary | ICD-10-CM | POA: Diagnosis not present

## 2020-11-11 DIAGNOSIS — H35032 Hypertensive retinopathy, left eye: Secondary | ICD-10-CM

## 2020-11-30 DIAGNOSIS — Z23 Encounter for immunization: Secondary | ICD-10-CM | POA: Diagnosis not present

## 2020-12-16 ENCOUNTER — Other Ambulatory Visit: Payer: Self-pay

## 2020-12-16 ENCOUNTER — Encounter (INDEPENDENT_AMBULATORY_CARE_PROVIDER_SITE_OTHER): Payer: Medicare Other | Admitting: Ophthalmology

## 2020-12-16 DIAGNOSIS — I10 Essential (primary) hypertension: Secondary | ICD-10-CM

## 2020-12-16 DIAGNOSIS — H43813 Vitreous degeneration, bilateral: Secondary | ICD-10-CM

## 2020-12-16 DIAGNOSIS — H34812 Central retinal vein occlusion, left eye, with macular edema: Secondary | ICD-10-CM | POA: Diagnosis not present

## 2020-12-16 DIAGNOSIS — H35032 Hypertensive retinopathy, left eye: Secondary | ICD-10-CM | POA: Diagnosis not present

## 2020-12-29 ENCOUNTER — Other Ambulatory Visit: Payer: Self-pay | Admitting: Dermatology

## 2020-12-29 DIAGNOSIS — L719 Rosacea, unspecified: Secondary | ICD-10-CM

## 2021-01-04 ENCOUNTER — Other Ambulatory Visit: Payer: Self-pay | Admitting: Dermatology

## 2021-01-20 ENCOUNTER — Encounter (INDEPENDENT_AMBULATORY_CARE_PROVIDER_SITE_OTHER): Payer: Medicare Other | Admitting: Ophthalmology

## 2021-01-20 ENCOUNTER — Other Ambulatory Visit: Payer: Self-pay

## 2021-01-20 DIAGNOSIS — H43813 Vitreous degeneration, bilateral: Secondary | ICD-10-CM

## 2021-01-20 DIAGNOSIS — I10 Essential (primary) hypertension: Secondary | ICD-10-CM | POA: Diagnosis not present

## 2021-01-20 DIAGNOSIS — H34812 Central retinal vein occlusion, left eye, with macular edema: Secondary | ICD-10-CM

## 2021-01-20 DIAGNOSIS — H35032 Hypertensive retinopathy, left eye: Secondary | ICD-10-CM | POA: Diagnosis not present

## 2021-01-20 DIAGNOSIS — H353111 Nonexudative age-related macular degeneration, right eye, early dry stage: Secondary | ICD-10-CM

## 2021-02-07 ENCOUNTER — Other Ambulatory Visit: Payer: Self-pay

## 2021-02-07 MED ORDER — METOPROLOL TARTRATE 25 MG PO TABS: 12.5000 mg | ORAL_TABLET | Freq: Two times a day (BID) | ORAL | 0 refills | Status: DC

## 2021-02-17 DIAGNOSIS — Z23 Encounter for immunization: Secondary | ICD-10-CM | POA: Diagnosis not present

## 2021-02-24 ENCOUNTER — Other Ambulatory Visit: Payer: Self-pay

## 2021-02-24 ENCOUNTER — Encounter (INDEPENDENT_AMBULATORY_CARE_PROVIDER_SITE_OTHER): Payer: Medicare Other | Admitting: Ophthalmology

## 2021-02-24 DIAGNOSIS — I10 Essential (primary) hypertension: Secondary | ICD-10-CM | POA: Diagnosis not present

## 2021-02-24 DIAGNOSIS — H35032 Hypertensive retinopathy, left eye: Secondary | ICD-10-CM

## 2021-02-24 DIAGNOSIS — H43812 Vitreous degeneration, left eye: Secondary | ICD-10-CM | POA: Diagnosis not present

## 2021-02-24 DIAGNOSIS — H34812 Central retinal vein occlusion, left eye, with macular edema: Secondary | ICD-10-CM | POA: Diagnosis not present

## 2021-04-04 ENCOUNTER — Other Ambulatory Visit: Payer: Self-pay

## 2021-04-04 ENCOUNTER — Encounter (INDEPENDENT_AMBULATORY_CARE_PROVIDER_SITE_OTHER): Payer: Medicare Other | Admitting: Ophthalmology

## 2021-04-04 DIAGNOSIS — I10 Essential (primary) hypertension: Secondary | ICD-10-CM

## 2021-04-04 DIAGNOSIS — H35032 Hypertensive retinopathy, left eye: Secondary | ICD-10-CM | POA: Diagnosis not present

## 2021-04-04 DIAGNOSIS — H43813 Vitreous degeneration, bilateral: Secondary | ICD-10-CM | POA: Diagnosis not present

## 2021-04-04 DIAGNOSIS — H34812 Central retinal vein occlusion, left eye, with macular edema: Secondary | ICD-10-CM

## 2021-04-07 DIAGNOSIS — Z23 Encounter for immunization: Secondary | ICD-10-CM | POA: Diagnosis not present

## 2021-04-13 ENCOUNTER — Ambulatory Visit (INDEPENDENT_AMBULATORY_CARE_PROVIDER_SITE_OTHER): Payer: Medicare Other | Admitting: Dermatology

## 2021-04-13 ENCOUNTER — Other Ambulatory Visit: Payer: Self-pay

## 2021-04-13 ENCOUNTER — Encounter: Payer: Self-pay | Admitting: Dermatology

## 2021-04-13 DIAGNOSIS — L570 Actinic keratosis: Secondary | ICD-10-CM | POA: Diagnosis not present

## 2021-04-13 DIAGNOSIS — D485 Neoplasm of uncertain behavior of skin: Secondary | ICD-10-CM

## 2021-04-13 DIAGNOSIS — L821 Other seborrheic keratosis: Secondary | ICD-10-CM | POA: Diagnosis not present

## 2021-04-13 DIAGNOSIS — D044 Carcinoma in situ of skin of scalp and neck: Secondary | ICD-10-CM

## 2021-04-13 NOTE — Patient Instructions (Signed)

## 2021-04-19 ENCOUNTER — Telehealth: Payer: Self-pay | Admitting: *Deleted

## 2021-04-19 NOTE — Telephone Encounter (Signed)
Left message for patient to call back for results.  

## 2021-04-19 NOTE — Telephone Encounter (Signed)
-----   Message from Lavonna Monarch, MD sent at 04/19/2021  5:49 AM EST ----- Scalp lesion was treated at the time of the biopsy, the other spot probably needs no further treatment.

## 2021-04-20 NOTE — Telephone Encounter (Signed)
Patient called for pathology results from last visit with Lavonna Monarch, M.D.  I read Dr. Onalee Hua notes to patient.

## 2021-04-21 ENCOUNTER — Telehealth: Payer: Self-pay

## 2021-04-21 NOTE — Telephone Encounter (Signed)
-----   Message from Lavonna Monarch, MD sent at 04/19/2021  5:49 AM EST ----- Scalp lesion was treated at the time of the biopsy, the other spot probably needs no further treatment.

## 2021-05-02 NOTE — Progress Notes (Signed)
° °  Follow-Up Visit   Subjective  Chris Chung is a 70 y.o. adult who presents for the following: Skin Cancer (Pt complains of a spot on the scalp that has been itching a lot x 78mo/Pt states that he had LN2 tx, /Last visit 10-21-2020 mid frontal scalp, [place]).  2 spots growing on scalp and face Location:  Duration:  Quality:  Associated Signs/Symptoms: Modifying Factors:  Severity:  Timing: Context:   Objective  Well appearing patient in no apparent distress; mood and affect are within normal limits. Left Zygomatic Area 6 mm waxy flesh-colored crust with tiny central erosion, 4.5cm from eyebrow     Mid Frontal Scalp 1 cm pink waxy crust, rule out carcinoma; 7.4 from eyebrow       A focused examination was performed including head neck. Relevant physical exam findings are noted in the Assessment and Plan.   Assessment & Plan    Neoplasm of uncertain behavior of skin (2) Left Zygomatic Area  Skin / nail biopsy Type of biopsy: tangential   Informed consent: discussed and consent obtained   Timeout: patient name, date of birth, surgical site, and procedure verified   Anesthesia: the lesion was anesthetized in a standard fashion   Anesthetic:  1% lidocaine w/ epinephrine 1-100,000 local infiltration Instrument used: flexible razor blade   Hemostasis achieved with: ferric subsulfate and electrodesiccation   Outcome: patient tolerated procedure well   Post-procedure details: wound care instructions given    Specimen 1 - Surgical pathology Differential Diagnosis: R/O BCC vs SCC  Check Margins: No  Mid Frontal Scalp  Skin / nail biopsy Type of biopsy: tangential   Informed consent: discussed and consent obtained   Timeout: patient name, date of birth, surgical site, and procedure verified   Anesthesia: the lesion was anesthetized in a standard fashion   Anesthetic:  1% lidocaine w/ epinephrine 1-100,000 local infiltration Instrument used: flexible razor blade    Hemostasis achieved with: ferric subsulfate and electrodesiccation   Outcome: patient tolerated procedure well   Post-procedure details: wound care instructions given    Destruction of lesion Complexity: simple   Destruction method: electrodesiccation and curettage   Informed consent: discussed and consent obtained   Timeout:  patient name, date of birth, surgical site, and procedure verified Anesthesia: the lesion was anesthetized in a standard fashion   Anesthetic:  1% lidocaine w/ epinephrine 1-100,000 local infiltration Curettage performed in three different directions: Yes   Electrodesiccation performed over the curetted area: Yes   Curettage cycles:  1 Lesion length (cm):  1.2 Lesion width (cm):  1.2 Margin per side (cm):  0 Final wound size (cm):  1.2 Hemostasis achieved with:  ferric subsulfate Outcome: patient tolerated procedure well with no complications   Post-procedure details: sterile dressing applied and wound care instructions given   Dressing type: bandage and petrolatum    Specimen 2 - Surgical pathology Differential Diagnosis: R/O BCC vs SCC - treated after biopsy  Check Margins: No  Because of the probability of superficial carcinoma, after shave biopsy the scalp lesion was curetted and cauterized.  Because there was a facial lesion with less certainty, the left cheek lesion was just biopsied.      I, Lavonna Monarch, MD, have reviewed all documentation for this visit.  The documentation on 05/02/21 for the exam, diagnosis, procedures, and orders are all accurate and complete.

## 2021-05-10 DIAGNOSIS — B009 Herpesviral infection, unspecified: Secondary | ICD-10-CM | POA: Diagnosis not present

## 2021-05-10 DIAGNOSIS — Z1389 Encounter for screening for other disorder: Secondary | ICD-10-CM | POA: Diagnosis not present

## 2021-05-10 DIAGNOSIS — I10 Essential (primary) hypertension: Secondary | ICD-10-CM | POA: Diagnosis not present

## 2021-05-10 DIAGNOSIS — H348192 Central retinal vein occlusion, unspecified eye, stable: Secondary | ICD-10-CM | POA: Diagnosis not present

## 2021-05-10 DIAGNOSIS — Z79899 Other long term (current) drug therapy: Secondary | ICD-10-CM | POA: Diagnosis not present

## 2021-05-10 DIAGNOSIS — Z0001 Encounter for general adult medical examination with abnormal findings: Secondary | ICD-10-CM | POA: Diagnosis not present

## 2021-05-10 DIAGNOSIS — E7800 Pure hypercholesterolemia, unspecified: Secondary | ICD-10-CM | POA: Diagnosis not present

## 2021-05-10 DIAGNOSIS — I2510 Atherosclerotic heart disease of native coronary artery without angina pectoris: Secondary | ICD-10-CM | POA: Diagnosis not present

## 2021-05-10 DIAGNOSIS — I493 Ventricular premature depolarization: Secondary | ICD-10-CM | POA: Diagnosis not present

## 2021-05-11 ENCOUNTER — Encounter (INDEPENDENT_AMBULATORY_CARE_PROVIDER_SITE_OTHER): Payer: Medicare Other | Admitting: Ophthalmology

## 2021-05-11 ENCOUNTER — Other Ambulatory Visit: Payer: Self-pay

## 2021-05-11 DIAGNOSIS — H35032 Hypertensive retinopathy, left eye: Secondary | ICD-10-CM

## 2021-05-11 DIAGNOSIS — H353111 Nonexudative age-related macular degeneration, right eye, early dry stage: Secondary | ICD-10-CM | POA: Diagnosis not present

## 2021-05-11 DIAGNOSIS — H43813 Vitreous degeneration, bilateral: Secondary | ICD-10-CM

## 2021-05-11 DIAGNOSIS — H34812 Central retinal vein occlusion, left eye, with macular edema: Secondary | ICD-10-CM | POA: Diagnosis not present

## 2021-05-11 DIAGNOSIS — I10 Essential (primary) hypertension: Secondary | ICD-10-CM

## 2021-06-15 ENCOUNTER — Encounter (INDEPENDENT_AMBULATORY_CARE_PROVIDER_SITE_OTHER): Payer: Medicare Other | Admitting: Ophthalmology

## 2021-06-15 ENCOUNTER — Other Ambulatory Visit: Payer: Self-pay

## 2021-06-15 DIAGNOSIS — H35032 Hypertensive retinopathy, left eye: Secondary | ICD-10-CM | POA: Diagnosis not present

## 2021-06-15 DIAGNOSIS — H2513 Age-related nuclear cataract, bilateral: Secondary | ICD-10-CM

## 2021-06-15 DIAGNOSIS — H353111 Nonexudative age-related macular degeneration, right eye, early dry stage: Secondary | ICD-10-CM

## 2021-06-15 DIAGNOSIS — H43813 Vitreous degeneration, bilateral: Secondary | ICD-10-CM | POA: Diagnosis not present

## 2021-06-15 DIAGNOSIS — H34812 Central retinal vein occlusion, left eye, with macular edema: Secondary | ICD-10-CM

## 2021-06-15 DIAGNOSIS — I10 Essential (primary) hypertension: Secondary | ICD-10-CM | POA: Diagnosis not present

## 2021-06-22 DIAGNOSIS — H40053 Ocular hypertension, bilateral: Secondary | ICD-10-CM | POA: Diagnosis not present

## 2021-07-03 NOTE — Progress Notes (Deleted)
?Cardiology Office Note:   ? ?Date:  07/03/2021  ? ?ID:  Chris Chung, DOB 07-02-1950, MRN [id number] ? ?PCP:  [name], MD ?  ?[facility] Providers ?Cardiologist:  Ena Dawley, MD { ? ? ?Referring MD: [name], MD  ? ? ?History of Present Illness:   ? ?Chris Chung is a 71 y.o. adult with a hx of mild CAD, palpitations, anxiety and BCC who was previously followed by Dr. [name] who now presents to clinic for follow-up. ? ?Per review of the record, the patient has history of palpitations. Wore a 48-holter monitor in 2016 that showed a short run atrial tachycardia lasting 3-4 seconds with a mean HR in the 70-80 range treated with metoprolol. Echocardiogram showed normal LVEF with mild upper septal hypertrophy with impaired relaxation. Coronary CTA performed  01/2016 showed a calcium score 52 with mild non-obstructive diffuse CAD.  ? ?Last seen in clinic on 05/2020 where he was doing well. No significant palpitations.  ? ?Today, *** ? ?Past Medical History:  ?Diagnosis Date  ? Abnormal computed tomography angiography (CTA) 01/21/2016  ? Anxiety   ? Atypical mole 12/18/1996  ? mid right back (slight) no tx  ? Atypical mole 04/01/2002  ? left back (slight) no tx  ? Atypical mole 11/17/2003  ? under right axilla (slight) no tx  ? Basal cell carcinoma 08/10/2005  ? front center scalp superior (mohs)  ? BCC (basal cell carcinoma of skin) 11/04/2018  ? distal left forearm (cx83fu)  ? BCC (basal cell carcinoma) 08/10/2005  ? front center scalp inferior (mohs)  ? BCC (basal cell carcinoma) 03/10/2013  ? right sholder ( cx79fu)  ? BCC (basal cell carcinoma) 10/25/2015  ? right scalp( cx70fu)  ? BCC (basal cell carcinoma) 02/08/1989  ? left back inferior   ? BCC (basal cell carcinoma) 02/08/1989  ? left back superior medial   ? BCC (basal cell carcinoma) 03/01/1992  ? mid sternum (cx34fu)  ? BCC (basal cell carcinoma) 09/17/1992  ? right outer brow (excision)  ? BCC (basal cell carcinoma) 10/31/1994  ? left  sholder (cx55fu)  ? BCC (basal cell carcinoma) 11/03/1997  ? proximal left forearm (cx44fu)  ? BCC (basal cell carcinoma) 11/03/1997  ? left scalp temple hairline superior (cx14fu)  ? BCC (basal cell carcinoma) 11/03/1997  ? left scalp temple hairline inferior (cx57fu)  ? BCC (basal cell carcinoma) 03/26/1998  ? top left sholder (cx3 excision)  ? BCC (basal cell carcinoma) 08/19/1998  ? top front scalp (cx3fu)  ? BCC (basal cell carcinoma) 03/12/2000  ? top scalp front (cx3 )  ? BCC (basal cell carcinoma) 04/01/2002  ? top front scalp (cx3 excision)  ? BCC (basal cell carcinoma) 07/08/2002  ? top right front scalp (excision)  ? BCC (basal cell carcinoma) 05/26/2003  ? right front scalp (cx3 excision)  ? BCC (basal cell carcinoma) 11/17/2003  ? front center scalp superior (cx55fu)  ? Elevated PSA   ? History of skin cancer   ? Hyperlipidemia 01/21/2016  ? Insomnia   ? Nodular basal cell carcinoma (BCC) 06/15/2020  ? Left Supraorbital Region [facility])  ? Palpitations   ? SCC (squamous cell carcinoma) 02/21/2010  ? left scalp (cx64fu)  ? SCC (squamous cell carcinoma) 10/02/2019  ? RIGHT FOREHEAD EXC  ? 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 right forehead (txpbx)  ? Superficial basal cell carcinoma (BCC) 06/15/2020  ? Mid Parietal Scalp (curet and 5FU)  ? Tinnitus, bilateral 07/04/2016  ? Vertigo 07/04/2016  ? ? ?  Past Surgical History:  ?Procedure Laterality Date  ? PROSTATE BIOPSY    ? SKIN CANCER EXCISION    ? TONSILLECTOMY    ? ? ?Current Medications: ?No outpatient medications have been marked as taking for the 07/08/21 encounter (Appointment) with Freada Bergeron, MD.  ?  ? ?Allergies:   Patient has no known allergies.  ? ?Social History  ? ?Socioeconomic History  ? Marital status: Married  ?  Spouse name: Not on file  ? Number of children: 3  ? Years of education: Not on file  ? Highest education level: Not on file  ?Occupational History  ? Occupation: RETIRED  ?Tobacco Use  ? Smoking status: Never  ?  Smokeless tobacco: Never  ?Vaping Use  ? Vaping Use: Never used  ?Substance and Sexual Activity  ? Alcohol use: No  ? Drug use: No  ? Sexual activity: Not on file  ?Other Topics Concern  ? Not on file  ?Social History Narrative  ? Not on file  ? ?Social Determinants of Health  ? ?Financial Resource Strain: Not on file  ?Food Insecurity: Not on file  ?Transportation Needs: Not on file  ?Physical Activity: Not on file  ?Stress: Not on file  ?Social Connections: Not on file  ?  ? ?Family History: ?The patient's ***family history is not on file. ? ?ROS:   ?Please see the history of present illness.    ?*** All other systems reviewed and are negative. ? ?EKGs/Labs/Other Studies Reviewed:   ? ?The following studies were reviewed today: ?Coronary CTA 01/2016: ?  ?1. Coronary calcium score of 52. This was 61 percentile for age and ?sex matched control. ?  ?2. Normal coronary origin with right dominance. ?  ?3. Mild diffuse non-obstructive CAD, an aggressive risk factor ?modification is recommended. ? ?EKG:  EKG is *** ordered today.  The ekg ordered today demonstrates *** ? ?Recent Labs: ?No results found for requested labs within last 8760 hours.  ?Recent Lipid Panel ?   ?Component Value Date/Time  ? CHOL 134 08/06/2019 0734  ? TRIG 124 08/06/2019 0734  ? HDL 52 08/06/2019 0734  ? CHOLHDL 2.6 08/06/2019 0734  ? CHOLHDL 3.5 01/25/2016 0756  ? VLDL 25 01/25/2016 0756  ? [place] 60 08/06/2019 0734  ? ? ? ?Risk Assessment/Calculations:   ?{Does this patient have ATRIAL FIBRILLATION?:[id number]} ? ?    ? ?Physical Exam:   ? ?VS:  There were no vitals taken for this visit.   ? ?Wt Readings from Last 3 Encounters:  ?05/26/20 165 lb (74.8 kg)  ?05/14/19 165 lb 9.6 oz (75.1 kg)  ?05/15/18 172 lb 12.8 oz (78.4 kg)  ?  ? ?GEN: *** Well nourished, well developed in no acute distress ?HEENT: Normal ?NECK: No JVD; No carotid bruits ?LYMPHATICS: No lymphadenopathy ?CARDIAC: ***RRR, no murmurs, rubs, gallops ?RESPIRATORY:  Clear to  auscultation without rales, wheezing or rhonchi  ?ABDOMEN: Soft, non-tender, non-distended ?MUSCULOSKELETAL:  No edema; No deformity  ?SKIN: Warm and dry ?NEUROLOGIC:  Alert and oriented x 3 ?PSYCHIATRIC:  Normal affect  ? ?ASSESSMENT:   ? ?No diagnosis found. ?PLAN:   ? ?In order of problems listed above: ? ?#Non-obstructive CAD: ?Coronary CTA 01/2016 with no obstructive disease. No anginal symptoms.  ?-Continue crestor 5mg  daily ?-Continue lifestyle modifications ?  ?#Palpitations: ?Prior monitor with short runs of atrial tachycardia but no AF. Denies recent palpitations  ?-Continue metoprolol12.5mg  BID ?  ?#HLD: ?-Continue crestor 5mg  daily ? ?   ? ?{Are you ordering  a CV Procedure (e.g. stress test, cath, DCCV, TEE, etc)?   Press F2        :[id number]}  ? ? ?Medication Adjustments/Labs and Tests Ordered: ?Current medicines are reviewed at length with the patient today.  Concerns regarding medicines are outlined above.  ?No orders of the defined types were placed in this encounter. ? ?No orders of the defined types were placed in this encounter. ? ? ?There are no Patient Instructions on file for this visit.  ? ?Signed, ?Freada Bergeron, MD  ?07/03/2021 8:10 PM    ?[place] ?

## 2021-07-08 ENCOUNTER — Other Ambulatory Visit: Payer: Self-pay | Admitting: Dermatology

## 2021-07-08 ENCOUNTER — Ambulatory Visit (INDEPENDENT_AMBULATORY_CARE_PROVIDER_SITE_OTHER): Payer: Medicare Other | Admitting: Cardiology

## 2021-07-08 ENCOUNTER — Encounter: Payer: Self-pay | Admitting: Cardiology

## 2021-07-08 ENCOUNTER — Other Ambulatory Visit: Payer: Self-pay

## 2021-07-08 VITALS — BP 122/64 | HR 80 | Ht 69.0 in | Wt 169.2 lb

## 2021-07-08 DIAGNOSIS — I2510 Atherosclerotic heart disease of native coronary artery without angina pectoris: Secondary | ICD-10-CM | POA: Diagnosis not present

## 2021-07-08 DIAGNOSIS — R002 Palpitations: Secondary | ICD-10-CM

## 2021-07-08 DIAGNOSIS — E785 Hyperlipidemia, unspecified: Secondary | ICD-10-CM | POA: Diagnosis not present

## 2021-07-08 MED ORDER — METOPROLOL TARTRATE 25 MG PO TABS: 12.5000 mg | ORAL_TABLET | Freq: Two times a day (BID) | ORAL | 3 refills | Status: DC

## 2021-07-08 MED ORDER — ROSUVASTATIN CALCIUM 5 MG PO TABS: ORAL_TABLET | ORAL | 11 refills | Status: DC

## 2021-07-08 NOTE — Progress Notes (Signed)
Cardiology Office Note:    Date:  07/08/2021   ID:  Chris Chung, DOB Nov 14, 1950, MRN [id number]  PCP:  [name], [facility] Providers Cardiologist:  Ena Dawley, MD {  Referring MD: [name], MD    History of Present Illness:    Chris Chung is a 71 y.o. adult with a hx of mild CAD, palpitations, anxiety and BCC who was previously followed by Dr. [name] who now presents to clinic for follow-up.  Per review of the record, the patient has history of palpitations. Wore a 48-holter monitor in 2016 that showed a short run atrial tachycardia lasting 3-4 seconds with a mean HR in the 70-80 range treated with metoprolol. Echocardiogram showed normal LVEF with mild upper septal hypertrophy with impaired relaxation. Coronary CTA performed  01/2016 showed a calcium score 52 with mild non-obstructive diffuse CAD.   Last seen in clinic on 05/2020 where he was doing well. No significant palpitations.   Today, the patient states that he is feeling good overall. After beginning metoprolol his palpitations had improved significantly. Only occasional episodes that are not bothersome.  He denies any chest pain, shortness of breath, or peripheral edema. No lightheadedness, headaches, syncope, orthopnea, or PND.  His most recent LDL was 62. He is taking rosuvastatin 5 days a week.  Past Medical History:  Diagnosis Date   Abnormal computed tomography angiography (CTA) 01/21/2016   Anxiety    Atypical mole 12/18/1996   mid right back (slight) no tx   Atypical mole 04/01/2002   left back (slight) no tx   Atypical mole 11/17/2003   under right axilla (slight) no tx   Basal cell carcinoma 08/10/2005   front center scalp superior (mohs)   BCC (basal cell carcinoma of skin) 11/04/2018   distal left forearm (cx35fu)   BCC (basal cell carcinoma) 08/10/2005   front center scalp inferior (mohs)   BCC (basal cell carcinoma) 03/10/2013   right sholder ( cx66fu)   BCC (basal cell  carcinoma) 10/25/2015   right scalp( cx55fu)   BCC (basal cell carcinoma) 02/08/1989   left back inferior    BCC (basal cell carcinoma) 02/08/1989   left back superior medial    BCC (basal cell carcinoma) 03/01/1992   mid sternum (cx29fu)   BCC (basal cell carcinoma) 09/17/1992   right outer brow (excision)   BCC (basal cell carcinoma) 10/31/1994   left sholder (cx10fu)   BCC (basal cell carcinoma) 11/03/1997   proximal left forearm (cx53fu)   BCC (basal cell carcinoma) 11/03/1997   left scalp temple hairline superior (cx49fu)   BCC (basal cell carcinoma) 11/03/1997   left scalp temple hairline inferior (cx85fu)   BCC (basal cell carcinoma) 03/26/1998   top left sholder (cx3 excision)   BCC (basal cell carcinoma) 08/19/1998   top front scalp (cx73fu)   BCC (basal cell carcinoma) 03/12/2000   top scalp front (cx3 )   BCC (basal cell carcinoma) 04/01/2002   top front scalp (cx3 excision)   BCC (basal cell carcinoma) 07/08/2002   top right front scalp (excision)   BCC (basal cell carcinoma) 05/26/2003   right front scalp (cx3 excision)   BCC (basal cell carcinoma) 11/17/2003   front center scalp superior (cx17fu)   Elevated PSA    History of skin cancer    Hyperlipidemia 01/21/2016   Insomnia    Nodular basal cell carcinoma (BCC) 06/15/2020   Left Supraorbital Region [facility])   Palpitations    SCC (squamous cell carcinoma) 02/21/2010  left scalp (cx14fu)   SCC (squamous cell carcinoma) 10/02/2019   RIGHT FOREHEAD EXC   Squamous cell carcinoma of skin 11/05/2018   right forehead (txpbx)   Superficial basal cell carcinoma (BCC) 06/15/2020   Mid Parietal Scalp (curet and 5FU)   Tinnitus, bilateral 07/04/2016   Vertigo 07/04/2016    Past Surgical History:  Procedure Laterality Date   PROSTATE BIOPSY     SKIN CANCER EXCISION     TONSILLECTOMY      Current Medications: Current Meds  Medication Sig   BESIVANCE 0.6 % SUSP    cholecalciferol (VITAMIN D) 1000 units  tablet Take 1,000 Units by mouth daily.   minocycline (MINOCIN) 50 MG capsule TAKE 1 CAPSULE(50 MG) BY MOUTH TWICE DAILY   valACYclovir (VALTREX) 500 MG tablet TAKE 1 TABLET(500 MG) BY MOUTH TWICE DAILY   [DISCONTINUED] metoprolol tartrate (LOPRESSOR) 25 MG tablet Take 0.5 tablets (12.5 mg total) by mouth 2 (two) times daily. Please make yearly appt with Cardiologist for January 2023 for future refills. Thank you 1st attempt   [DISCONTINUED] rosuvastatin (CRESTOR) 5 MG tablet TAKE 1 TABLET BY MOUTH 5 TIMES A WEEK     Allergies:   Patient has no known allergies.   Social History   Socioeconomic History   Marital status: Married    Spouse name: Not on file   Number of children: 3   Years of education: Not on file   Highest education level: Not on file  Occupational History   Occupation: RETIRED  Tobacco Use   Smoking status: Never   Smokeless tobacco: Never  Vaping Use   Vaping Use: Never used  Substance and Sexual Activity   Alcohol use: No   Drug use: No   Sexual activity: Not on file  Other Topics Concern   Not on file  Social History Narrative   Not on file   Social Determinants of Health   Financial Resource Strain: Not on file  Food Insecurity: Not on file  Transportation Needs: Not on file  Physical Activity: Not on file  Stress: Not on file  Social Connections: Not on file     Family History: The patient's family history is not on file.  ROS:   Review of Systems  Constitutional:  Negative for chills and diaphoresis.  HENT:  Negative for nosebleeds and tinnitus.   Eyes:  Negative for blurred vision.  Respiratory:  Negative for cough and sputum production.   Cardiovascular:  Positive for palpitations. Negative for chest pain, orthopnea, claudication, leg swelling and PND.  Gastrointestinal:  Negative for diarrhea and nausea.  Genitourinary:  Negative for hematuria.  Musculoskeletal:  Negative for falls.  Neurological:  Negative for dizziness and headaches.   Endo/Heme/Allergies:  Does not bruise/bleed easily.  Psychiatric/Behavioral:  Negative for depression and suicidal ideas.     EKGs/Labs/Other Studies Reviewed:    The following studies were reviewed today:  Coronary CTA 01/2016: 1. Coronary calcium score of 52. This was 95 percentile for age and sex matched control.   2. Normal coronary origin with right dominance.   3. Mild diffuse non-obstructive CAD, an aggressive risk factor modification is recommended.  Echo 02/11/2015: Study Conclusions   - Left ventricle: The cavity size was normal. There was mild focal    basal hypertrophy of the septum. Systolic function was normal.    The estimated ejection fraction was in the range of 60% to 65%.    Wall motion was normal; there were no regional wall motion  abnormalities. Doppler parameters are consistent with abnormal    left ventricular relaxation (grade 1 diastolic dysfunction).  - Mitral valve: Transvalvular velocity was within the normal range.    There was no evidence for stenosis. There was no regurgitation.  - Right ventricle: The cavity size was normal. Wall thickness was    normal. Systolic function was normal.  - Atrial septum: No defect or patent foramen ovale was identified.  - Tricuspid valve: There was trivial regurgitation.  - Inferior vena cava: The vessel was normal in size. The    respirophasic diameter changes were in the normal range (= 50%),    consistent with normal central venous pressure.  EKG:  EKG is personally reviewed. 07/08/2021: Sinus rhythm. Rate 80 bpm.  Recent Labs: No results found for requested labs within last 8760 hours.   Recent Lipid Panel    Component Value Date/Time   CHOL 134 08/06/2019 0734   TRIG 124 08/06/2019 0734   HDL 52 08/06/2019 0734   CHOLHDL 2.6 08/06/2019 0734   CHOLHDL 3.5 01/25/2016 0756   VLDL 25 01/25/2016 0756   LDLCALC 60 08/06/2019 0734     Risk Assessment/Calculations:           Physical Exam:     VS:  BP 122/64    Pulse 80    Ht 5\' 9"  (1.753 m)    Wt 169 lb 3.2 oz (76.7 kg)    SpO2 97%    BMI 24.99 kg/m     Wt Readings from Last 3 Encounters:  07/08/21 169 lb 3.2 oz (76.7 kg)  05/26/20 165 lb (74.8 kg)  05/14/19 165 lb 9.6 oz (75.1 kg)     GEN: Well nourished, well developed in no acute distress HEENT: Normal NECK: No JVD; No carotid bruits LYMPHATICS: No lymphadenopathy CARDIAC: RRR, no murmurs, rubs, gallops RESPIRATORY:  Clear to auscultation without rales, wheezing or rhonchi  ABDOMEN: Soft, non-tender, non-distended MUSCULOSKELETAL:  No edema; No deformity  SKIN: Warm and dry NEUROLOGIC:  Alert and oriented x 3 PSYCHIATRIC:  Normal affect   ASSESSMENT:    1. Coronary artery disease involving native coronary artery of native heart without angina pectoris   2. Hyperlipidemia, unspecified hyperlipidemia type   3. Heart palpitations    PLAN:    In order of problems listed above:  #Non-obstructive CAD: Coronary CTA 01/2016 with no obstructive disease. No anginal symptoms.  -Continue crestor 5mg  daily -Continue lifestyle modifications   #Palpitations: Prior monitor with short runs of atrial tachycardia but no AF. Denies recent palpitations  -Continue metoprolol12.5mg  BID   #HLD: -Continue crestor 5mg  daily      Follow-up:   1 year.  Medication Adjustments/Labs and Tests Ordered: Current medicines are reviewed at length with the patient today.  Concerns regarding medicines are outlined above.   Orders Placed This Encounter  Procedures   EKG 12-Lead   Meds ordered this encounter  Medications   rosuvastatin (CRESTOR) 5 MG tablet    Sig: Take 1 tablet by mouth 5 times a week.    Dispense:  25 tablet    Refill:  11   metoprolol tartrate (LOPRESSOR) 25 MG tablet    Sig: Take 0.5 tablets (12.5 mg total) by mouth 2 (two) times daily.    Dispense:  90 tablet    Refill:  3   Patient Instructions  Medication Instructions:   Your physician recommends  that you continue on your current medications as directed. Please refer to the Current Medication list given  to you today.  *If you need a refill on your cardiac medications before your next appointment, please call your pharmacy*   Follow-Up: At [facility], you and your health needs are our priority.  As part of our continuing mission to provide you with exceptional heart care, we have created designated Provider Care Teams.  These Care Teams include your primary Cardiologist (physician) and Advanced Practice Providers (APPs -  Physician Assistants and Nurse Practitioners) who all work together to provide you with the care you need, when you need it.  We recommend signing up for the patient portal called "MyChart".  Sign up information is provided on this After Visit Summary.  MyChart is used to connect with patients for Virtual Visits (Telemedicine).  Patients are able to view lab/test results, encounter notes, upcoming appointments, etc.  Non-urgent messages can be sent to your provider as well.   To learn more about what you can do with MyChart, go to [email or web address].    Your next appointment:   1 year(s)  The format for your next appointment:   In Person  Provider:   Dr. Delmar Landau Stumpf,acting as a scribe for Freada Bergeron, MD.,have documented all relevant documentation on the behalf of Freada Bergeron, MD,as directed by  Freada Bergeron, MD while in the presence of Freada Bergeron, MD.  I, Freada Bergeron, MD, have reviewed all documentation for this visit. The documentation on 07/08/21 for the exam, diagnosis, procedures, and orders are all accurate and complete.   Signed, Freada Bergeron, MD  07/08/2021 9:49 AM    [facility]

## 2021-07-08 NOTE — Patient Instructions (Signed)
Medication Instructions:  ? ?Your physician recommends that you continue on your current medications as directed. Please refer to the Current Medication list given to you today. ? ?*If you need a refill on your cardiac medications before your next appointment, please call your pharmacy* ? ? ?Follow-Up: ?At [facility], you and your health needs are our priority.  As part of our continuing mission to provide you with exceptional heart care, we have created designated Provider Care Teams.  These Care Teams include your primary Cardiologist (physician) and Advanced Practice Providers (APPs -  Physician Assistants and Nurse Practitioners) who all work together to provide you with the care you need, when you need it. ? ?We recommend signing up for the patient portal called "MyChart".  Sign up information is provided on this After Visit Summary.  MyChart is used to connect with patients for Virtual Visits (Telemedicine).  Patients are able to view lab/test results, encounter notes, upcoming appointments, etc.  Non-urgent messages can be sent to your provider as well.   ?To learn more about what you can do with MyChart, go to [email or web address].   ? ?Your next appointment:   ?1 year(s) ? ?The format for your next appointment:   ?In Person ? ?Provider:   ?Dr. Johney Frame  ? ?

## 2021-07-11 ENCOUNTER — Other Ambulatory Visit: Payer: Self-pay

## 2021-07-11 ENCOUNTER — Ambulatory Visit (INDEPENDENT_AMBULATORY_CARE_PROVIDER_SITE_OTHER): Payer: Medicare Other | Admitting: Dermatology

## 2021-07-11 ENCOUNTER — Encounter: Payer: Self-pay | Admitting: Dermatology

## 2021-07-11 DIAGNOSIS — C4441 Basal cell carcinoma of skin of scalp and neck: Secondary | ICD-10-CM

## 2021-07-11 DIAGNOSIS — L570 Actinic keratosis: Secondary | ICD-10-CM | POA: Diagnosis not present

## 2021-07-11 DIAGNOSIS — L820 Inflamed seborrheic keratosis: Secondary | ICD-10-CM | POA: Diagnosis not present

## 2021-07-11 DIAGNOSIS — D485 Neoplasm of uncertain behavior of skin: Secondary | ICD-10-CM | POA: Diagnosis not present

## 2021-07-11 DIAGNOSIS — L821 Other seborrheic keratosis: Secondary | ICD-10-CM | POA: Diagnosis not present

## 2021-07-11 NOTE — Patient Instructions (Signed)

## 2021-07-18 ENCOUNTER — Telehealth: Payer: Self-pay | Admitting: Dermatology

## 2021-07-18 ENCOUNTER — Telehealth: Payer: Self-pay | Admitting: *Deleted

## 2021-07-18 NOTE — Telephone Encounter (Signed)
-----   Message from Lavonna Monarch, MD sent at 07/15/2021  5:24 AM EST ----- ?The biopsy from the uppermost forehead/scalp did show basal cell skin cancer.  Although this is small, the microscopic pattern is associated with increased local recurrence.  If patient wants me to do the surgery I am willing, but if either there is recurrence or a positive margin plan B would be Mohs surgery. ?

## 2021-07-18 NOTE — Telephone Encounter (Signed)
Returning call, probably for results ?

## 2021-07-18 NOTE — Telephone Encounter (Signed)
Path to patient and Dr Delma Officer message was read to the patient and he would like to proceed with the mohs surgery. [name] stated he would feel more comfortable with a one ans done surgery. All patient information sen tot the skin surgery center . ?

## 2021-07-18 NOTE — Telephone Encounter (Signed)
Left message for patient to return our phone call.

## 2021-07-22 ENCOUNTER — Encounter: Payer: Self-pay | Admitting: Dermatology

## 2021-07-22 NOTE — Progress Notes (Signed)
? ?Follow-Up Visit ?  ?Subjective  ?Chris Chung is a 71 y.o. adult who presents for the following: Skin Problem (Lesion forehead x year. Lesion forehead x 2 months - itching. Lesion on left cheek x 2 months. ). ? ?Several enlarging facial and scalp crusts ?Location:  ?Duration:  ?Quality:  ?Associated Signs/Symptoms: ?Modifying Factors:  ?Severity:  ?Timing: ?Context:  ? ?Objective  ?Well appearing patient in no apparent distress; mood and affect are within normal limits. ?Mid Frontal Scalp ?Ill-defined 9 mm pink crust ? ? ? ? ? ? ?Right Forehead ?Waxy 7 mm pink and white crust ? ? ? ? ? ? ?Left Buccal Cheek ?Waxy white 6 mm crust ? ? ? ? ? ? ?Right Buccal Cheek ?Eroded 6 mm crust ? ? ? ? ? ? ?Mid Forehead ?Inflamed tan-pink 4 mm crust ? ? ? ?A focused examination was performed including head and neck. Relevant physical exam findings are noted in the Assessment and Plan. ? ? ?Assessment & Plan  ? ? ?Neoplasm of uncertain behavior of skin (4) ?Mid Frontal Scalp ? ?Skin / nail biopsy ?Type of biopsy: tangential   ?Informed consent: discussed and consent obtained   ?Timeout: patient name, date of birth, surgical site, and procedure verified   ?Anesthesia: the lesion was anesthetized in a standard fashion   ?Anesthetic:  1% lidocaine w/ epinephrine 1-100,000 local infiltration ?Instrument used: flexible razor blade   ?Hemostasis achieved with: ferric subsulfate   ?Outcome: patient tolerated procedure well   ?Post-procedure details: wound care instructions given   ? ?Specimen 1 - Surgical pathology ?Differential Diagnosis: isk vs scc ? ?Check Margins: No ? ?Right Forehead ? ?Skin / nail biopsy ?Type of biopsy: tangential   ?Informed consent: discussed and consent obtained   ?Timeout: patient name, date of birth, surgical site, and procedure verified   ?Anesthesia: the lesion was anesthetized in a standard fashion   ?Anesthetic:  1% lidocaine w/ epinephrine 1-100,000 local infiltration ?Instrument used: flexible razor  blade   ?Hemostasis achieved with: ferric subsulfate   ?Outcome: patient tolerated procedure well   ?Post-procedure details: wound care instructions given   ? ?Specimen 2 - Surgical pathology ?Differential Diagnosis: isk vs scc ? ?Check Margins: No ? ?Left Buccal Cheek ? ?Skin / nail biopsy ?Type of biopsy: tangential   ?Informed consent: discussed and consent obtained   ?Timeout: patient name, date of birth, surgical site, and procedure verified   ?Anesthesia: the lesion was anesthetized in a standard fashion   ?Anesthetic:  1% lidocaine w/ epinephrine 1-100,000 local infiltration ?Instrument used: flexible razor blade   ?Hemostasis achieved with: ferric subsulfate   ?Outcome: patient tolerated procedure well   ?Post-procedure details: wound care instructions given   ? ?Specimen 3 - Surgical pathology ?Differential Diagnosis: isk vs scc ? ?Check Margins: No ? ?Right Buccal Cheek ? ?Skin / nail biopsy ?Type of biopsy: tangential   ?Informed consent: discussed and consent obtained   ?Timeout: patient name, date of birth, surgical site, and procedure verified   ?Anesthesia: the lesion was anesthetized in a standard fashion   ?Anesthetic:  1% lidocaine w/ epinephrine 1-100,000 local infiltration ?Instrument used: flexible razor blade   ?Hemostasis achieved with: ferric subsulfate   ?Outcome: patient tolerated procedure well   ?Post-procedure details: wound care instructions given   ? ?Specimen 4 - Surgical pathology ?Differential Diagnosis: isk vs scc ? ?Check Margins: No ? ?Seborrheic keratosis, inflamed ?Mid Forehead ? ?Destruction of lesion - Mid Forehead ?Complexity: simple   ?Destruction method: cryotherapy   ?  Informed consent: discussed and consent obtained   ?Timeout:  patient name, date of birth, surgical site, and procedure verified ?Lesion destroyed using liquid nitrogen: Yes   ?Cryotherapy cycles:  3 ?Outcome: patient tolerated procedure well with no complications   ?Post-procedure details: wound care  instructions given   ? ? ? ? ? ?I, Lavonna Monarch, MD, have reviewed all documentation for this visit.  The documentation on 07/22/21 for the exam, diagnosis, procedures, and orders are all accurate and complete. ?

## 2021-07-27 ENCOUNTER — Other Ambulatory Visit: Payer: Self-pay

## 2021-07-27 ENCOUNTER — Encounter (INDEPENDENT_AMBULATORY_CARE_PROVIDER_SITE_OTHER): Payer: Medicare Other | Admitting: Ophthalmology

## 2021-07-27 DIAGNOSIS — H34812 Central retinal vein occlusion, left eye, with macular edema: Secondary | ICD-10-CM

## 2021-07-27 DIAGNOSIS — H35032 Hypertensive retinopathy, left eye: Secondary | ICD-10-CM | POA: Diagnosis not present

## 2021-07-27 DIAGNOSIS — I10 Essential (primary) hypertension: Secondary | ICD-10-CM

## 2021-07-27 DIAGNOSIS — H43812 Vitreous degeneration, left eye: Secondary | ICD-10-CM | POA: Diagnosis not present

## 2021-08-26 ENCOUNTER — Other Ambulatory Visit: Payer: Self-pay | Admitting: Cardiology

## 2021-08-26 DIAGNOSIS — E785 Hyperlipidemia, unspecified: Secondary | ICD-10-CM

## 2021-08-31 ENCOUNTER — Encounter (INDEPENDENT_AMBULATORY_CARE_PROVIDER_SITE_OTHER): Payer: Medicare Other | Admitting: Ophthalmology

## 2021-08-31 DIAGNOSIS — H35032 Hypertensive retinopathy, left eye: Secondary | ICD-10-CM | POA: Diagnosis not present

## 2021-08-31 DIAGNOSIS — H43813 Vitreous degeneration, bilateral: Secondary | ICD-10-CM

## 2021-08-31 DIAGNOSIS — I10 Essential (primary) hypertension: Secondary | ICD-10-CM | POA: Diagnosis not present

## 2021-08-31 DIAGNOSIS — H34812 Central retinal vein occlusion, left eye, with macular edema: Secondary | ICD-10-CM | POA: Diagnosis not present

## 2021-10-11 ENCOUNTER — Encounter (INDEPENDENT_AMBULATORY_CARE_PROVIDER_SITE_OTHER): Payer: Medicare Other | Admitting: Ophthalmology

## 2021-10-11 DIAGNOSIS — H2513 Age-related nuclear cataract, bilateral: Secondary | ICD-10-CM | POA: Diagnosis not present

## 2021-10-11 DIAGNOSIS — H43813 Vitreous degeneration, bilateral: Secondary | ICD-10-CM

## 2021-10-11 DIAGNOSIS — H34812 Central retinal vein occlusion, left eye, with macular edema: Secondary | ICD-10-CM | POA: Diagnosis not present

## 2021-10-13 DIAGNOSIS — C4441 Basal cell carcinoma of skin of scalp and neck: Secondary | ICD-10-CM | POA: Diagnosis not present

## 2021-11-23 ENCOUNTER — Encounter (INDEPENDENT_AMBULATORY_CARE_PROVIDER_SITE_OTHER): Payer: Medicare Other | Admitting: Ophthalmology

## 2021-11-23 DIAGNOSIS — H43813 Vitreous degeneration, bilateral: Secondary | ICD-10-CM

## 2021-11-23 DIAGNOSIS — H35032 Hypertensive retinopathy, left eye: Secondary | ICD-10-CM | POA: Diagnosis not present

## 2021-11-23 DIAGNOSIS — I10 Essential (primary) hypertension: Secondary | ICD-10-CM | POA: Diagnosis not present

## 2021-11-23 DIAGNOSIS — H34812 Central retinal vein occlusion, left eye, with macular edema: Secondary | ICD-10-CM

## 2021-12-05 ENCOUNTER — Other Ambulatory Visit: Payer: Self-pay | Admitting: Dermatology

## 2021-12-05 DIAGNOSIS — L719 Rosacea, unspecified: Secondary | ICD-10-CM

## 2022-01-04 ENCOUNTER — Encounter (INDEPENDENT_AMBULATORY_CARE_PROVIDER_SITE_OTHER): Payer: Medicare Other | Admitting: Ophthalmology

## 2022-01-04 DIAGNOSIS — I10 Essential (primary) hypertension: Secondary | ICD-10-CM | POA: Diagnosis not present

## 2022-01-04 DIAGNOSIS — H43813 Vitreous degeneration, bilateral: Secondary | ICD-10-CM | POA: Diagnosis not present

## 2022-01-04 DIAGNOSIS — H34812 Central retinal vein occlusion, left eye, with macular edema: Secondary | ICD-10-CM

## 2022-01-04 DIAGNOSIS — H35033 Hypertensive retinopathy, bilateral: Secondary | ICD-10-CM | POA: Diagnosis not present

## 2022-02-02 DIAGNOSIS — L718 Other rosacea: Secondary | ICD-10-CM | POA: Diagnosis not present

## 2022-02-02 DIAGNOSIS — B001 Herpesviral vesicular dermatitis: Secondary | ICD-10-CM | POA: Diagnosis not present

## 2022-02-02 DIAGNOSIS — L298 Other pruritus: Secondary | ICD-10-CM | POA: Diagnosis not present

## 2022-02-02 DIAGNOSIS — L820 Inflamed seborrheic keratosis: Secondary | ICD-10-CM | POA: Diagnosis not present

## 2022-02-02 DIAGNOSIS — L538 Other specified erythematous conditions: Secondary | ICD-10-CM | POA: Diagnosis not present

## 2022-02-02 DIAGNOSIS — Z789 Other specified health status: Secondary | ICD-10-CM | POA: Diagnosis not present

## 2022-02-15 ENCOUNTER — Encounter (INDEPENDENT_AMBULATORY_CARE_PROVIDER_SITE_OTHER): Payer: Medicare Other | Admitting: Ophthalmology

## 2022-02-15 DIAGNOSIS — H35032 Hypertensive retinopathy, left eye: Secondary | ICD-10-CM | POA: Diagnosis not present

## 2022-02-15 DIAGNOSIS — H34812 Central retinal vein occlusion, left eye, with macular edema: Secondary | ICD-10-CM

## 2022-02-15 DIAGNOSIS — I10 Essential (primary) hypertension: Secondary | ICD-10-CM

## 2022-02-15 DIAGNOSIS — H353111 Nonexudative age-related macular degeneration, right eye, early dry stage: Secondary | ICD-10-CM | POA: Diagnosis not present

## 2022-02-15 DIAGNOSIS — H43813 Vitreous degeneration, bilateral: Secondary | ICD-10-CM

## 2022-03-03 DIAGNOSIS — Z23 Encounter for immunization: Secondary | ICD-10-CM | POA: Diagnosis not present

## 2022-03-27 ENCOUNTER — Encounter (INDEPENDENT_AMBULATORY_CARE_PROVIDER_SITE_OTHER): Payer: Medicare Other | Admitting: Ophthalmology

## 2022-03-27 DIAGNOSIS — H35032 Hypertensive retinopathy, left eye: Secondary | ICD-10-CM | POA: Diagnosis not present

## 2022-03-27 DIAGNOSIS — I10 Essential (primary) hypertension: Secondary | ICD-10-CM

## 2022-03-27 DIAGNOSIS — H43812 Vitreous degeneration, left eye: Secondary | ICD-10-CM

## 2022-03-27 DIAGNOSIS — H353111 Nonexudative age-related macular degeneration, right eye, early dry stage: Secondary | ICD-10-CM

## 2022-03-27 DIAGNOSIS — H34812 Central retinal vein occlusion, left eye, with macular edema: Secondary | ICD-10-CM

## 2022-04-07 DIAGNOSIS — D485 Neoplasm of uncertain behavior of skin: Secondary | ICD-10-CM | POA: Diagnosis not present

## 2022-04-07 DIAGNOSIS — Z08 Encounter for follow-up examination after completed treatment for malignant neoplasm: Secondary | ICD-10-CM | POA: Diagnosis not present

## 2022-04-07 DIAGNOSIS — D1801 Hemangioma of skin and subcutaneous tissue: Secondary | ICD-10-CM | POA: Diagnosis not present

## 2022-04-07 DIAGNOSIS — Z85828 Personal history of other malignant neoplasm of skin: Secondary | ICD-10-CM | POA: Diagnosis not present

## 2022-04-07 DIAGNOSIS — C44519 Basal cell carcinoma of skin of other part of trunk: Secondary | ICD-10-CM | POA: Diagnosis not present

## 2022-04-07 DIAGNOSIS — L821 Other seborrheic keratosis: Secondary | ICD-10-CM | POA: Diagnosis not present

## 2022-04-07 DIAGNOSIS — L814 Other melanin hyperpigmentation: Secondary | ICD-10-CM | POA: Diagnosis not present

## 2022-04-07 DIAGNOSIS — L570 Actinic keratosis: Secondary | ICD-10-CM | POA: Diagnosis not present

## 2022-04-17 ENCOUNTER — Ambulatory Visit: Payer: Medicare Other | Admitting: Dermatology

## 2022-04-21 DIAGNOSIS — Z23 Encounter for immunization: Secondary | ICD-10-CM | POA: Diagnosis not present

## 2022-04-26 DIAGNOSIS — L989 Disorder of the skin and subcutaneous tissue, unspecified: Secondary | ICD-10-CM | POA: Diagnosis not present

## 2022-04-26 DIAGNOSIS — C44519 Basal cell carcinoma of skin of other part of trunk: Secondary | ICD-10-CM | POA: Diagnosis not present

## 2022-05-10 ENCOUNTER — Encounter (INDEPENDENT_AMBULATORY_CARE_PROVIDER_SITE_OTHER): Payer: Medicare Other | Admitting: Ophthalmology

## 2022-05-10 DIAGNOSIS — H34812 Central retinal vein occlusion, left eye, with macular edema: Secondary | ICD-10-CM | POA: Diagnosis not present

## 2022-05-10 DIAGNOSIS — I10 Essential (primary) hypertension: Secondary | ICD-10-CM

## 2022-05-10 DIAGNOSIS — H43813 Vitreous degeneration, bilateral: Secondary | ICD-10-CM

## 2022-05-10 DIAGNOSIS — H353112 Nonexudative age-related macular degeneration, right eye, intermediate dry stage: Secondary | ICD-10-CM | POA: Diagnosis not present

## 2022-05-10 DIAGNOSIS — H35032 Hypertensive retinopathy, left eye: Secondary | ICD-10-CM | POA: Diagnosis not present

## 2022-05-17 DIAGNOSIS — Z0001 Encounter for general adult medical examination with abnormal findings: Secondary | ICD-10-CM | POA: Diagnosis not present

## 2022-05-17 DIAGNOSIS — I2510 Atherosclerotic heart disease of native coronary artery without angina pectoris: Secondary | ICD-10-CM | POA: Diagnosis not present

## 2022-05-17 DIAGNOSIS — Z79899 Other long term (current) drug therapy: Secondary | ICD-10-CM | POA: Diagnosis not present

## 2022-05-17 DIAGNOSIS — H348192 Central retinal vein occlusion, unspecified eye, stable: Secondary | ICD-10-CM | POA: Diagnosis not present

## 2022-05-17 DIAGNOSIS — I493 Ventricular premature depolarization: Secondary | ICD-10-CM | POA: Diagnosis not present

## 2022-05-17 DIAGNOSIS — E7800 Pure hypercholesterolemia, unspecified: Secondary | ICD-10-CM | POA: Diagnosis not present

## 2022-05-17 DIAGNOSIS — I10 Essential (primary) hypertension: Secondary | ICD-10-CM | POA: Diagnosis not present

## 2022-05-17 DIAGNOSIS — F419 Anxiety disorder, unspecified: Secondary | ICD-10-CM | POA: Diagnosis not present

## 2022-05-31 ENCOUNTER — Other Ambulatory Visit: Payer: Self-pay

## 2022-05-31 DIAGNOSIS — E785 Hyperlipidemia, unspecified: Secondary | ICD-10-CM

## 2022-05-31 MED ORDER — METOPROLOL TARTRATE 25 MG PO TABS: 12.5000 mg | ORAL_TABLET | Freq: Two times a day (BID) | ORAL | 0 refills | Status: DC

## 2022-06-22 DIAGNOSIS — H40053 Ocular hypertension, bilateral: Secondary | ICD-10-CM | POA: Diagnosis not present

## 2022-06-26 ENCOUNTER — Other Ambulatory Visit: Payer: Self-pay

## 2022-06-26 DIAGNOSIS — E785 Hyperlipidemia, unspecified: Secondary | ICD-10-CM

## 2022-06-26 MED ORDER — ROSUVASTATIN CALCIUM 5 MG PO TABS: ORAL_TABLET | ORAL | 0 refills | Status: DC

## 2022-06-28 ENCOUNTER — Encounter (INDEPENDENT_AMBULATORY_CARE_PROVIDER_SITE_OTHER): Payer: Medicare Other | Admitting: Ophthalmology

## 2022-06-28 DIAGNOSIS — H35033 Hypertensive retinopathy, bilateral: Secondary | ICD-10-CM | POA: Diagnosis not present

## 2022-06-28 DIAGNOSIS — H353111 Nonexudative age-related macular degeneration, right eye, early dry stage: Secondary | ICD-10-CM

## 2022-06-28 DIAGNOSIS — I10 Essential (primary) hypertension: Secondary | ICD-10-CM

## 2022-06-28 DIAGNOSIS — H34812 Central retinal vein occlusion, left eye, with macular edema: Secondary | ICD-10-CM

## 2022-06-28 DIAGNOSIS — H43813 Vitreous degeneration, bilateral: Secondary | ICD-10-CM | POA: Diagnosis not present

## 2022-07-12 NOTE — Progress Notes (Deleted)
Cardiology Office Note:    Date:  07/12/2022   ID:  Chris Chung, DOB [birth year], MRN [id number]  PCP:  Chris Chung, [facility] Providers Cardiologist:  Ena Dawley, MD {  Referring MD: Chris Amel, MD    History of Present Illness:    Chris Chung is a 72 y.o. male with a hx of mild CAD, palpitations, anxiety and BCC who was previously followed by Dr. [name] who now presents to clinic for follow-up.  Per review of the record, the patient has history of palpitations. Wore a 48-holter monitor in 2016 that showed a short run atrial tachycardia lasting 3-4 seconds with a mean HR in the 70-80 range treated with metoprolol. Echocardiogram showed normal LVEF with mild upper septal hypertrophy with impaired relaxation. Coronary CTA performed  01/2016 showed a calcium score 52 with mild non-obstructive diffuse CAD.   Last seen in clinic on 07/2021 where he was doing well from a CV standpoint.  Today, ***  Past Medical History:  Diagnosis Date   Abnormal computed tomography angiography (CTA) 01/21/2016   Anxiety    Atypical mole 12/18/1996   mid right back (slight) no tx   Atypical mole 04/01/2002   left back (slight) no tx   Atypical mole 11/17/2003   under right axilla (slight) no tx   Basal cell carcinoma 08/10/2005   front center scalp superior (mohs)   BCC (basal cell carcinoma of skin) 11/04/2018   distal left forearm (cx77f)   BCC (basal cell carcinoma) 08/10/2005   front center scalp inferior (mohs)   BCC (basal cell carcinoma) 03/10/2013   right sholder ( cx349f   BCC (basal cell carcinoma) 10/25/2015   right scalp( cx3512f  BCC (basal cell carcinoma) 02/08/1989   left back inferior    BCC (basal cell carcinoma) 02/08/1989   left back superior medial    BCC (basal cell carcinoma) 03/01/1992   mid sternum ([id number] BCC (basal cell carcinoma) 09/17/1992   right outer brow (excision)   BCC (basal cell carcinoma) 10/31/1994   left sholder  ([id number] (basal cell carcinoma) 11/03/1997   proximal left forearm (cx35fu)43fCC (basal cell carcinoma) 11/03/1997   left scalp temple hairline superior (cx35fu) 66fC (basal cell carcinoma) 11/03/1997   left scalp temple hairline inferior (cx35fu)  31f (basal cell carcinoma) 03/26/1998   top left sholder (cx3 excision)   BCC (basal cell carcinoma) 08/19/1998   top front scalp (cx35fu)   84f(basal cell carcinoma) 03/12/2000   top scalp front (cx3 )   BCC (basal cell carcinoma) 04/01/2002   top front scalp (cx3 excision)   BCC (basal cell carcinoma) 07/08/2002   top right front scalp (excision)   BCC (basal cell carcinoma) 05/26/2003   right front scalp (cx3 excision)   BCC (basal cell carcinoma) 11/17/2003   front center scalp superior (cx35fu)   E43fted PSA    History of skin cancer    Hyperlipidemia 01/21/2016   Insomnia    Nodular basal cell carcinoma (BCC) 06/15/2020   Left Supraorbital Region (MOH's)   [facility] Fallations    SCC (squamous cell carcinoma) 02/21/2010   left scalp (cx35fu)   SC66fquamous cell carcinoma) 10/02/2019   RIGHT FOREHEAD EXC   Squamous cell carcinoma of skin 11/05/2018   right forehead (txpbx)   Superficial basal cell carcinoma (BCC) 06/15/2020   Mid Parietal Scalp (curet and 5FU)   Tinnitus, bilateral 07/04/2016   Vertigo 07/04/2016  Past Surgical History:  Procedure Laterality Date   PROSTATE BIOPSY     SKIN CANCER EXCISION     TONSILLECTOMY      Current Medications: No outpatient medications have been marked as taking for the 07/14/22 encounter (Appointment) with Freada Bergeron, MD.     Allergies:   Patient has no known allergies.   Social History   Socioeconomic History   Marital status: Married    Spouse name: Not on file   Number of children: 3   Years of education: Not on file   Highest education level: Not on file  Occupational History   Occupation: RETIRED  Tobacco Use   Smoking status: Never   Smokeless  tobacco: Never  Vaping Use   Vaping Use: Never used  Substance and Sexual Activity   Alcohol use: No   Drug use: No   Sexual activity: Not on file  Other Topics Concern   Not on file  Social History Narrative   Not on file   Social Determinants of Health   Financial Resource Strain: Not on file  Food Insecurity: Not on file  Transportation Needs: Not on file  Physical Activity: Not on file  Stress: Not on file  Social Connections: Not on file     Family History: The patient's family history is not on file.  ROS:   Review of Systems  Constitutional:  Negative for chills and diaphoresis.  HENT:  Negative for nosebleeds and tinnitus.   Eyes:  Negative for blurred vision.  Respiratory:  Negative for cough and sputum production.   Cardiovascular:  Positive for palpitations. Negative for chest pain, orthopnea, claudication, leg swelling and PND.  Gastrointestinal:  Negative for diarrhea and nausea.  Genitourinary:  Negative for hematuria.  Musculoskeletal:  Negative for falls.  Neurological:  Negative for dizziness and headaches.  Endo/Heme/Allergies:  Does not bruise/bleed easily.  Psychiatric/Behavioral:  Negative for depression and suicidal ideas.      EKGs/Labs/Other Studies Reviewed:    The following studies were reviewed today:  Coronary CTA 01/2016: 1. Coronary calcium score of 52. This was 29 percentile for age and sex matched control.   2. Normal coronary origin with right dominance.   3. Mild diffuse non-obstructive CAD, an aggressive risk factor modification is recommended.  Echo 02/11/2015: Study Conclusions   - Left ventricle: The cavity size was normal. There was mild focal    basal hypertrophy of the septum. Systolic function was normal.    The estimated ejection fraction was in the range of 60% to 65%.    Wall motion was normal; there were no regional wall motion    abnormalities. Doppler parameters are consistent with abnormal    left ventricular  relaxation (grade 1 diastolic dysfunction).  - Mitral valve: Transvalvular velocity was within the normal range.    There was no evidence for stenosis. There was no regurgitation.  - Right ventricle: The cavity size was normal. Wall thickness was    normal. Systolic function was normal.  - Atrial septum: No defect or patent foramen ovale was identified.  - Tricuspid valve: There was trivial regurgitation.  - Inferior vena cava: The vessel was normal in size. The    respirophasic diameter changes were in the normal range (= 50%),    consistent with normal central venous pressure.  EKG:  EKG is personally reviewed. 07/08/2021: Sinus rhythm. Rate 80 bpm.  Recent Labs: No results found for requested labs within last 365 days.   Recent Lipid Panel  Component Value Date/Time   CHOL 134 08/06/2019 0734   TRIG 124 08/06/2019 0734   HDL 52 08/06/2019 0734   CHOLHDL 2.6 08/06/2019 0734   CHOLHDL 3.5 01/25/2016 0756   VLDL 25 01/25/2016 0756   LDLCALC 60 08/06/2019 0734     Risk Assessment/Calculations:           Physical Exam:    VS:  There were no vitals taken for this visit.    Wt Readings from Last 3 Encounters:  07/08/21 169 lb 3.2 oz (76.7 kg)  05/26/20 165 lb (74.8 kg)  05/14/19 165 lb 9.6 oz (75.1 kg)     GEN: Well nourished, well developed in no acute distress HEENT: Normal NECK: No JVD; No carotid bruits LYMPHATICS: No lymphadenopathy CARDIAC: RRR, no murmurs, rubs, gallops RESPIRATORY:  Clear to auscultation without rales, wheezing or rhonchi  ABDOMEN: Soft, non-tender, non-distended MUSCULOSKELETAL:  No edema; No deformity  SKIN: Warm and dry NEUROLOGIC:  Alert and oriented x 3 PSYCHIATRIC:  Normal affect   ASSESSMENT:    No diagnosis found.  PLAN:    In order of problems listed above:  #Non-obstructive CAD: Coronary CTA 01/2016 with no obstructive disease. No anginal symptoms.  -Continue crestor '5mg'$  daily -Continue lifestyle modifications    #Palpitations: Prior monitor with short runs of atrial tachycardia but no AF. Denies recent palpitations  -Continue metoprolol12.'5mg'$  BID   #HLD: -Continue crestor '5mg'$  daily      Follow-up:   1 year.  Medication Adjustments/Labs and Tests Ordered: Current medicines are reviewed at length with the patient today.  Concerns regarding medicines are outlined above.   No orders of the defined types were placed in this encounter.  No orders of the defined types were placed in this encounter.  There are no Patient Instructions on file for this visit.     Signed, Freada Bergeron, MD  07/12/2022 10:17 AM    [place]

## 2022-07-14 ENCOUNTER — Encounter: Payer: Self-pay | Admitting: Cardiology

## 2022-07-14 ENCOUNTER — Ambulatory Visit: Payer: Medicare Other | Attending: Cardiology | Admitting: Cardiology

## 2022-07-14 VITALS — BP 120/74 | HR 75 | Ht 69.0 in | Wt 168.2 lb

## 2022-07-14 DIAGNOSIS — I2510 Atherosclerotic heart disease of native coronary artery without angina pectoris: Secondary | ICD-10-CM | POA: Diagnosis not present

## 2022-07-14 DIAGNOSIS — R002 Palpitations: Secondary | ICD-10-CM | POA: Diagnosis not present

## 2022-07-14 DIAGNOSIS — E785 Hyperlipidemia, unspecified: Secondary | ICD-10-CM | POA: Insufficient documentation

## 2022-07-14 NOTE — Progress Notes (Signed)
Cardiology Office Note:    Date:  07/14/2022   ID:  Chris Chung, DOB Mar 06, 1951, MRN [id number]  PCP:  Chris Chung, [facility] Providers Cardiologist:  Chris Dawley, MD {  Referring MD: Chris Amel, MD    History of Present Illness:    Chris Chung is a 72 y.o. male with a hx of mild CAD, palpitations, anxiety and BCC who was previously followed by Dr. Meda Chung who now presents to clinic for follow-up.  Per review of the record, the patient has history of palpitations. Wore a 48-holter monitor in 2016 that showed a short run atrial tachycardia lasting 3-4 seconds with a mean HR in the 70-80 range treated with metoprolol. Echocardiogram showed normal LVEF with mild upper septal hypertrophy with impaired relaxation. Coronary CTA performed  01/2016 showed a calcium score 52 with mild non-obstructive diffuse CAD.   Last seen in clinic on 07/2021 where he was doing well from a CV standpoint.  Today, patient states he feels very well. No chest pain or SOB at rest or with exertion. He has been cycling for past several years, about an hour 3-4 times/week and then weight training on other days. Feels well with activity. Tolerating medications as prescribed. BP well controlled.    Past Medical History:  Diagnosis Date   Abnormal computed tomography angiography (CTA) 01/21/2016   Anxiety    Atypical mole 12/18/1996   mid right back (slight) no tx   Atypical mole 04/01/2002   left back (slight) no tx   Atypical mole 11/17/2003   under right axilla (slight) no tx   Basal cell carcinoma 08/10/2005   front center scalp superior (mohs)   BCC (basal cell carcinoma of skin) 11/04/2018   distal left forearm (cx58f)   BCC (basal cell carcinoma) 08/10/2005   front center scalp inferior (mohs)   BCC (basal cell carcinoma) 03/10/2013   right sholder ( cx354f   BCC (basal cell carcinoma) 10/25/2015   right scalp( cx3512f  BCC (basal cell carcinoma) 02/08/1989   left back  inferior    BCC (basal cell carcinoma) 02/08/1989   left back superior medial    BCC (basal cell carcinoma) 03/01/1992   mid sternum (cx35f63f BCC (basal cell carcinoma) 09/17/1992   right outer brow (excision)   BCC (basal cell carcinoma) 10/31/1994   left sholder (cx35fu74fBCC (basal cell carcinoma) 11/03/1997   proximal left forearm (cx35fu)23fCC (basal cell carcinoma) 11/03/1997   left scalp temple hairline superior (cx35fu) 79fC (basal cell carcinoma) 11/03/1997   left scalp temple hairline inferior (cx35fu)  91f (basal cell carcinoma) 03/26/1998   top left sholder (cx3 excision)   BCC (basal cell carcinoma) 08/19/1998   top front scalp (cx35fu)   31f(basal cell carcinoma) 03/12/2000   top scalp front (cx3 )   BCC (basal cell carcinoma) 04/01/2002   top front scalp (cx3 excision)   BCC (basal cell carcinoma) 07/08/2002   top right front scalp (excision)   BCC (basal cell carcinoma) 05/26/2003   right front scalp (cx3 excision)   BCC (basal cell carcinoma) 11/17/2003   front center scalp superior (cx35fu)   E52fted PSA    History of skin cancer    Hyperlipidemia 01/21/2016   Insomnia    Nodular basal cell carcinoma (BCC) 06/15/2020   Left Supraorbital Region (MOH's)   [facility]    SCC (squamous cell carcinoma) 02/21/2010   left scalp (cx35fu)   SC82fquamous  cell carcinoma) 10/02/2019   RIGHT FOREHEAD EXC   Squamous cell carcinoma of skin 11/05/2018   right forehead (txpbx)   Superficial basal cell carcinoma (BCC) 06/15/2020   Mid Parietal Scalp (curet and 5FU)   Tinnitus, bilateral 07/04/2016   Vertigo 07/04/2016    Past Surgical History:  Procedure Laterality Date   PROSTATE BIOPSY     SKIN CANCER EXCISION     TONSILLECTOMY      Current Medications: Current Meds  Medication Sig   BESIVANCE 0.6 % SUSP    cholecalciferol (VITAMIN D) 1000 units tablet Take 1,000 Units by mouth daily.   metoprolol tartrate (LOPRESSOR) 25 MG tablet Take 0.5  tablets (12.5 mg total) by mouth 2 (two) times daily.   minocycline (MINOCIN) 50 MG capsule TAKE 1 CAPSULE(50 MG) BY MOUTH TWICE DAILY   rosuvastatin (CRESTOR) 5 MG tablet Take 1 tablet by mouth 5 times a week.   valACYclovir (VALTREX) 500 MG tablet TAKE 1 TABLET(500 MG) BY MOUTH TWICE DAILY     Allergies:   Patient has no known allergies.   Social History   Socioeconomic History   Marital status: Married    Spouse name: Not on file   Number of children: 3   Years of education: Not on file   Highest education level: Not on file  Occupational History   Occupation: RETIRED  Tobacco Use   Smoking status: Never   Smokeless tobacco: Never  Vaping Use   Vaping Use: Never used  Substance and Sexual Activity   Alcohol use: No   Drug use: No   Sexual activity: Not on file  Other Topics Concern   Not on file  Social History Narrative   Not on file   Social Determinants of Health   Financial Resource Strain: Not on file  Food Insecurity: Not on file  Transportation Needs: Not on file  Physical Activity: Not on file  Stress: Not on file  Social Connections: Not on file     Family History: The patient's family history is not on file.  ROS:   Review of Systems  Constitutional:  Negative for chills and diaphoresis.  HENT:  Negative for nosebleeds and tinnitus.   Eyes:  Negative for blurred vision.  Respiratory:  Negative for cough and sputum production.   Cardiovascular:  Positive for palpitations. Negative for chest pain, orthopnea, claudication, leg swelling and PND.  Gastrointestinal:  Negative for diarrhea and nausea.  Genitourinary:  Negative for hematuria.  Musculoskeletal:  Negative for falls.  Neurological:  Negative for dizziness and headaches.  Endo/Heme/Allergies:  Does not bruise/bleed easily.  Psychiatric/Behavioral:  Negative for depression and suicidal ideas.      EKGs/Labs/Other Studies Reviewed:    The following studies were reviewed today:  Coronary  CTA 01/2016: 1. Coronary calcium score of 52. This was 85 percentile for age and sex matched control.   2. Normal coronary origin with right dominance.   3. Mild diffuse non-obstructive CAD, an aggressive risk factor modification is recommended.  Echo 02/11/2015: Study Conclusions   - Left ventricle: The cavity size was normal. There was mild focal    basal hypertrophy of the septum. Systolic function was normal.    The estimated ejection fraction was in the range of 60% to 65%.    Wall motion was normal; there were no regional wall motion    abnormalities. Doppler parameters are consistent with abnormal    left ventricular relaxation (grade 1 diastolic dysfunction).  - Mitral valve: Transvalvular velocity  was within the normal range.    There was no evidence for stenosis. There was no regurgitation.  - Right ventricle: The cavity size was normal. Wall thickness was    normal. Systolic function was normal.  - Atrial septum: No defect or patent foramen ovale was identified.  - Tricuspid valve: There was trivial regurgitation.  - Inferior vena cava: The vessel was normal in size. The    respirophasic diameter changes were in the normal range (= 50%),    consistent with normal central venous pressure.  EKG:  EKG is personally reviewed. 07/08/2021: Sinus rhythm. Rate 80 bpm.  Recent Labs: No results found for requested labs within last 365 days.   Recent Lipid Panel    Component Value Date/Time   CHOL 134 08/06/2019 0734   TRIG 124 08/06/2019 0734   HDL 52 08/06/2019 0734   CHOLHDL 2.6 08/06/2019 0734   CHOLHDL 3.5 01/25/2016 0756   VLDL 25 01/25/2016 0756   LDLCALC 60 08/06/2019 0734     Risk Assessment/Calculations:           Physical Exam:    VS:  BP 120/74   Pulse 75   Ht '5\' 9"'$  (1.753 m)   Wt 168 lb 3.2 oz (76.3 kg)   SpO2 99%   BMI 24.84 kg/m     Wt Readings from Last 3 Encounters:  07/14/22 168 lb 3.2 oz (76.3 kg)  07/08/21 169 lb 3.2 oz (76.7 kg)   05/26/20 165 lb (74.8 kg)     GEN: Well nourished, well developed in no acute distress HEENT: Normal NECK: No JVD; No carotid bruits CARDIAC: RRR, no murmurs, rubs, gallops RESPIRATORY:  Clear to auscultation without rales, wheezing or rhonchi  ABDOMEN: Soft, non-tender, non-distended MUSCULOSKELETAL:  No edema; No deformity  SKIN: Warm and dry NEUROLOGIC:  Alert and oriented x 3 PSYCHIATRIC:  Normal affect   ASSESSMENT:    1. Coronary artery disease involving native coronary artery of native heart without angina pectoris   2. Heart palpitations   3. Hyperlipidemia, unspecified hyperlipidemia type    PLAN:    In order of problems listed above:  #Non-obstructive CAD: Coronary CTA 01/2016 with no obstructive disease. No anginal symptoms.  -Continue crestor '5mg'$  daily -Continue lifestyle modifications   #Palpitations: Prior monitor with short runs of atrial tachycardia but no AF. Palpitations significantly improved, only occurring briefly a couple times per month. -Continue metoprolol12.'5mg'$  BID   #HLD: -Continue crestor '5mg'$  daily -LDL 73 -Continue lifestyle modifications      Follow-up:   1 year.  Medication Adjustments/Labs and Tests Ordered: Current medicines are reviewed at length with the patient today.  Concerns regarding medicines are outlined above.   Orders Placed This Encounter  Procedures   EKG 12-Lead   No orders of the defined types were placed in this encounter.  Patient Instructions  Medication Instructions:   Your physician recommends that you continue on your current medications as directed. Please refer to the Current Medication list given to you today.  *If you need a refill on your cardiac medications before your next appointment, please call your pharmacy*    Follow-Up: At [facility], you and your health needs are our priority.  As part of our continuing mission to provide you with exceptional heart care, we have created  designated Provider Care Teams.  These Care Teams include your primary Cardiologist (physician) and Advanced Practice Providers (APPs -  Physician Assistants and Nurse Practitioners) who all work together to provide  you with the care you need, when you need it.  We recommend signing up for the patient portal called "MyChart".  Sign up information is provided on this After Visit Summary.  MyChart is used to connect with patients for Virtual Visits (Telemedicine).  Patients are able to view lab/test results, encounter notes, upcoming appointments, etc.  Non-urgent messages can be sent to your provider as well.   To learn more about what you can do with MyChart, go to [email or web address].    Your next appointment:   1 year(s)  Provider:   DR. Johney Frame      Signed, Freada Bergeron, MD  07/14/2022 9:20 AM    [place]

## 2022-07-14 NOTE — Patient Instructions (Signed)
Medication Instructions:   Your physician recommends that you continue on your current medications as directed. Please refer to the Current Medication list given to you today.  *If you need a refill on your cardiac medications before your next appointment, please call your pharmacy*   Follow-Up: At [facility], you and your health needs are our priority.  As part of our continuing mission to provide you with exceptional heart care, we have created designated Provider Care Teams.  These Care Teams include your primary Cardiologist (physician) and Advanced Practice Providers (APPs -  Physician Assistants and Nurse Practitioners) who all work together to provide you with the care you need, when you need it.  We recommend signing up for the patient portal called "MyChart".  Sign up information is provided on this After Visit Summary.  MyChart is used to connect with patients for Virtual Visits (Telemedicine).  Patients are able to view lab/test results, encounter notes, upcoming appointments, etc.  Non-urgent messages can be sent to your provider as well.   To learn more about what you can do with MyChart, go to [email or web address].    Your next appointment:   1 year(s)  Provider:   DR. P[place]   

## 2022-07-26 DIAGNOSIS — D045 Carcinoma in situ of skin of trunk: Secondary | ICD-10-CM | POA: Diagnosis not present

## 2022-07-26 DIAGNOSIS — L820 Inflamed seborrheic keratosis: Secondary | ICD-10-CM | POA: Diagnosis not present

## 2022-07-26 DIAGNOSIS — C44519 Basal cell carcinoma of skin of other part of trunk: Secondary | ICD-10-CM | POA: Diagnosis not present

## 2022-07-26 DIAGNOSIS — D485 Neoplasm of uncertain behavior of skin: Secondary | ICD-10-CM | POA: Diagnosis not present

## 2022-07-26 DIAGNOSIS — D2271 Melanocytic nevi of right lower limb, including hip: Secondary | ICD-10-CM | POA: Diagnosis not present

## 2022-07-26 DIAGNOSIS — D1801 Hemangioma of skin and subcutaneous tissue: Secondary | ICD-10-CM | POA: Diagnosis not present

## 2022-07-26 DIAGNOSIS — L570 Actinic keratosis: Secondary | ICD-10-CM | POA: Diagnosis not present

## 2022-07-26 DIAGNOSIS — L821 Other seborrheic keratosis: Secondary | ICD-10-CM | POA: Diagnosis not present

## 2022-07-26 DIAGNOSIS — C4441 Basal cell carcinoma of skin of scalp and neck: Secondary | ICD-10-CM | POA: Diagnosis not present

## 2022-07-26 DIAGNOSIS — L119 Acantholytic disorder, unspecified: Secondary | ICD-10-CM | POA: Diagnosis not present

## 2022-07-26 DIAGNOSIS — D225 Melanocytic nevi of trunk: Secondary | ICD-10-CM | POA: Diagnosis not present

## 2022-07-26 DIAGNOSIS — D2272 Melanocytic nevi of left lower limb, including hip: Secondary | ICD-10-CM | POA: Diagnosis not present

## 2022-08-15 DIAGNOSIS — L718 Other rosacea: Secondary | ICD-10-CM | POA: Diagnosis not present

## 2022-08-15 DIAGNOSIS — C4441 Basal cell carcinoma of skin of scalp and neck: Secondary | ICD-10-CM | POA: Diagnosis not present

## 2022-08-23 ENCOUNTER — Encounter (INDEPENDENT_AMBULATORY_CARE_PROVIDER_SITE_OTHER): Payer: Medicare Other | Admitting: Ophthalmology

## 2022-08-23 DIAGNOSIS — H353112 Nonexudative age-related macular degeneration, right eye, intermediate dry stage: Secondary | ICD-10-CM | POA: Diagnosis not present

## 2022-08-23 DIAGNOSIS — H35032 Hypertensive retinopathy, left eye: Secondary | ICD-10-CM | POA: Diagnosis not present

## 2022-08-23 DIAGNOSIS — H34812 Central retinal vein occlusion, left eye, with macular edema: Secondary | ICD-10-CM

## 2022-08-23 DIAGNOSIS — I10 Essential (primary) hypertension: Secondary | ICD-10-CM

## 2022-08-23 DIAGNOSIS — H43813 Vitreous degeneration, bilateral: Secondary | ICD-10-CM

## 2022-09-04 ENCOUNTER — Other Ambulatory Visit: Payer: Self-pay | Admitting: *Deleted

## 2022-09-04 DIAGNOSIS — E785 Hyperlipidemia, unspecified: Secondary | ICD-10-CM

## 2022-09-04 MED ORDER — METOPROLOL TARTRATE 25 MG PO TABS: 12.5000 mg | ORAL_TABLET | Freq: Two times a day (BID) | ORAL | 3 refills | Status: AC

## 2022-09-06 ENCOUNTER — Other Ambulatory Visit: Payer: Self-pay

## 2022-09-06 DIAGNOSIS — E785 Hyperlipidemia, unspecified: Secondary | ICD-10-CM

## 2022-09-06 MED ORDER — ROSUVASTATIN CALCIUM 5 MG PO TABS: ORAL_TABLET | ORAL | 3 refills | Status: DC

## 2022-09-27 DIAGNOSIS — Z85828 Personal history of other malignant neoplasm of skin: Secondary | ICD-10-CM | POA: Diagnosis not present

## 2022-09-27 DIAGNOSIS — L218 Other seborrheic dermatitis: Secondary | ICD-10-CM | POA: Diagnosis not present

## 2022-10-25 ENCOUNTER — Encounter (INDEPENDENT_AMBULATORY_CARE_PROVIDER_SITE_OTHER): Payer: Medicare Other | Admitting: Ophthalmology

## 2022-10-25 DIAGNOSIS — H43813 Vitreous degeneration, bilateral: Secondary | ICD-10-CM | POA: Diagnosis not present

## 2022-10-25 DIAGNOSIS — H34812 Central retinal vein occlusion, left eye, with macular edema: Secondary | ICD-10-CM

## 2022-10-25 DIAGNOSIS — H35032 Hypertensive retinopathy, left eye: Secondary | ICD-10-CM | POA: Diagnosis not present

## 2022-10-25 DIAGNOSIS — H353131 Nonexudative age-related macular degeneration, bilateral, early dry stage: Secondary | ICD-10-CM | POA: Diagnosis not present

## 2022-10-25 DIAGNOSIS — I10 Essential (primary) hypertension: Secondary | ICD-10-CM | POA: Diagnosis not present

## 2022-11-29 ENCOUNTER — Telehealth: Payer: Self-pay | Admitting: Cardiology

## 2022-11-29 NOTE — Telephone Encounter (Signed)
Pt can follow-up with Dr. Anne Fu as his new primary Cardiologist.   Will direct this call back to scheduling team to assist the pt in getting his next follow-up appt/recall with Dr. Anne Fu, at the time he is currently due to see Dr. Shari Prows.

## 2022-11-29 NOTE — Telephone Encounter (Signed)
Patient wants Dr. Anne Fu to be his new dr. Please advise

## 2022-11-29 NOTE — Telephone Encounter (Signed)
Called patient informing him his recall has been switched to Dr. Anne Fu, so he will receive a letter when it is time to schedule his follow up with him in March.

## 2023-01-24 ENCOUNTER — Encounter (INDEPENDENT_AMBULATORY_CARE_PROVIDER_SITE_OTHER): Payer: Medicare Other | Admitting: Ophthalmology

## 2023-01-24 DIAGNOSIS — I10 Essential (primary) hypertension: Secondary | ICD-10-CM | POA: Diagnosis not present

## 2023-01-24 DIAGNOSIS — H43813 Vitreous degeneration, bilateral: Secondary | ICD-10-CM

## 2023-01-24 DIAGNOSIS — H34812 Central retinal vein occlusion, left eye, with macular edema: Secondary | ICD-10-CM | POA: Diagnosis not present

## 2023-01-24 DIAGNOSIS — H35032 Hypertensive retinopathy, left eye: Secondary | ICD-10-CM

## 2023-01-24 DIAGNOSIS — H353111 Nonexudative age-related macular degeneration, right eye, early dry stage: Secondary | ICD-10-CM

## 2023-02-20 DIAGNOSIS — Z23 Encounter for immunization: Secondary | ICD-10-CM | POA: Diagnosis not present

## 2023-03-21 ENCOUNTER — Encounter (INDEPENDENT_AMBULATORY_CARE_PROVIDER_SITE_OTHER): Payer: Medicare Other | Admitting: Ophthalmology

## 2023-03-21 DIAGNOSIS — H43813 Vitreous degeneration, bilateral: Secondary | ICD-10-CM

## 2023-03-21 DIAGNOSIS — H34812 Central retinal vein occlusion, left eye, with macular edema: Secondary | ICD-10-CM

## 2023-03-21 DIAGNOSIS — H353111 Nonexudative age-related macular degeneration, right eye, early dry stage: Secondary | ICD-10-CM

## 2023-05-22 DIAGNOSIS — Z1331 Encounter for screening for depression: Secondary | ICD-10-CM | POA: Diagnosis not present

## 2023-05-22 DIAGNOSIS — I2510 Atherosclerotic heart disease of native coronary artery without angina pectoris: Secondary | ICD-10-CM | POA: Diagnosis not present

## 2023-05-22 DIAGNOSIS — F419 Anxiety disorder, unspecified: Secondary | ICD-10-CM | POA: Diagnosis not present

## 2023-05-22 DIAGNOSIS — Z79899 Other long term (current) drug therapy: Secondary | ICD-10-CM | POA: Diagnosis not present

## 2023-05-22 DIAGNOSIS — Z0001 Encounter for general adult medical examination with abnormal findings: Secondary | ICD-10-CM | POA: Diagnosis not present

## 2023-05-22 DIAGNOSIS — H348192 Central retinal vein occlusion, unspecified eye, stable: Secondary | ICD-10-CM | POA: Diagnosis not present

## 2023-05-22 DIAGNOSIS — I10 Essential (primary) hypertension: Secondary | ICD-10-CM | POA: Diagnosis not present

## 2023-05-22 DIAGNOSIS — I493 Ventricular premature depolarization: Secondary | ICD-10-CM | POA: Diagnosis not present

## 2023-05-22 DIAGNOSIS — E7800 Pure hypercholesterolemia, unspecified: Secondary | ICD-10-CM | POA: Diagnosis not present

## 2023-05-23 ENCOUNTER — Encounter (INDEPENDENT_AMBULATORY_CARE_PROVIDER_SITE_OTHER): Payer: Medicare Other | Admitting: Ophthalmology

## 2023-05-23 DIAGNOSIS — H353111 Nonexudative age-related macular degeneration, right eye, early dry stage: Secondary | ICD-10-CM

## 2023-05-23 DIAGNOSIS — I10 Essential (primary) hypertension: Secondary | ICD-10-CM

## 2023-05-23 DIAGNOSIS — H34812 Central retinal vein occlusion, left eye, with macular edema: Secondary | ICD-10-CM

## 2023-05-23 DIAGNOSIS — H35033 Hypertensive retinopathy, bilateral: Secondary | ICD-10-CM

## 2023-05-23 DIAGNOSIS — H43813 Vitreous degeneration, bilateral: Secondary | ICD-10-CM | POA: Diagnosis not present

## 2023-06-25 DIAGNOSIS — H2513 Age-related nuclear cataract, bilateral: Secondary | ICD-10-CM | POA: Diagnosis not present

## 2023-07-27 DIAGNOSIS — I10 Essential (primary) hypertension: Secondary | ICD-10-CM | POA: Diagnosis not present

## 2023-07-27 DIAGNOSIS — I2510 Atherosclerotic heart disease of native coronary artery without angina pectoris: Secondary | ICD-10-CM | POA: Diagnosis not present

## 2023-07-30 DIAGNOSIS — D485 Neoplasm of uncertain behavior of skin: Secondary | ICD-10-CM | POA: Diagnosis not present

## 2023-07-30 DIAGNOSIS — L905 Scar conditions and fibrosis of skin: Secondary | ICD-10-CM | POA: Diagnosis not present

## 2023-07-30 DIAGNOSIS — C4441 Basal cell carcinoma of skin of scalp and neck: Secondary | ICD-10-CM | POA: Diagnosis not present

## 2023-07-30 DIAGNOSIS — L570 Actinic keratosis: Secondary | ICD-10-CM | POA: Diagnosis not present

## 2023-07-30 DIAGNOSIS — D045 Carcinoma in situ of skin of trunk: Secondary | ICD-10-CM | POA: Diagnosis not present

## 2023-07-30 DIAGNOSIS — L821 Other seborrheic keratosis: Secondary | ICD-10-CM | POA: Diagnosis not present

## 2023-07-30 DIAGNOSIS — Z85828 Personal history of other malignant neoplasm of skin: Secondary | ICD-10-CM | POA: Diagnosis not present

## 2023-08-01 ENCOUNTER — Encounter (INDEPENDENT_AMBULATORY_CARE_PROVIDER_SITE_OTHER): Payer: Medicare Other | Admitting: Ophthalmology

## 2023-08-01 DIAGNOSIS — H43813 Vitreous degeneration, bilateral: Secondary | ICD-10-CM | POA: Diagnosis not present

## 2023-08-01 DIAGNOSIS — H353111 Nonexudative age-related macular degeneration, right eye, early dry stage: Secondary | ICD-10-CM

## 2023-08-01 DIAGNOSIS — I10 Essential (primary) hypertension: Secondary | ICD-10-CM | POA: Diagnosis not present

## 2023-08-01 DIAGNOSIS — H35033 Hypertensive retinopathy, bilateral: Secondary | ICD-10-CM

## 2023-08-01 DIAGNOSIS — H34812 Central retinal vein occlusion, left eye, with macular edema: Secondary | ICD-10-CM | POA: Diagnosis not present

## 2023-08-01 DIAGNOSIS — H2513 Age-related nuclear cataract, bilateral: Secondary | ICD-10-CM

## 2023-08-06 DIAGNOSIS — I2510 Atherosclerotic heart disease of native coronary artery without angina pectoris: Secondary | ICD-10-CM | POA: Diagnosis not present

## 2023-08-06 DIAGNOSIS — E7800 Pure hypercholesterolemia, unspecified: Secondary | ICD-10-CM | POA: Diagnosis not present

## 2023-08-06 DIAGNOSIS — I10 Essential (primary) hypertension: Secondary | ICD-10-CM | POA: Diagnosis not present

## 2023-08-08 ENCOUNTER — Encounter: Payer: Self-pay | Admitting: Cardiology

## 2023-08-08 ENCOUNTER — Ambulatory Visit: Payer: Medicare Other | Attending: Cardiology | Admitting: Cardiology

## 2023-08-08 VITALS — BP 156/72 | HR 89 | Ht 69.0 in | Wt 167.6 lb

## 2023-08-08 DIAGNOSIS — E785 Hyperlipidemia, unspecified: Secondary | ICD-10-CM | POA: Insufficient documentation

## 2023-08-08 DIAGNOSIS — I2510 Atherosclerotic heart disease of native coronary artery without angina pectoris: Secondary | ICD-10-CM | POA: Insufficient documentation

## 2023-08-08 DIAGNOSIS — R002 Palpitations: Secondary | ICD-10-CM | POA: Diagnosis not present

## 2023-08-08 NOTE — Progress Notes (Signed)
 Cardiology Office Note:  .   Date:  08/08/2023  ID:  Chris Chung, DOB Aug 27, 1950, MRN [id number] PCP: Darrow Bussing, MD  [facility] Providers Cardiologist:  Tobias Alexander, MD    History of Present Illness: .   Chris Chung is a 73 y.o. male Discussed the use of AI scribe software for clinical note transcription with the patient, who gave verbal consent to proceed.  History of Present Illness Chris Chung "[name]" is a 73 year old male with mild coronary disease who presents for follow-up of palpitations.  He experiences palpitations that began after his retirement at age 78. These occur sporadically, with periods of weeks or months without symptoms, but sometimes he persists throughout a day. He associates the onset of palpitations with stress, noting they often occur during stressful periods and usually resolve by the next day. No anemia, as his hemoglobin is 16.5.  He has mild coronary disease, identified by a coronary CTA in 2017 with a calcium score of 52 indicating mild nonobstructive disease. An echocardiogram has shown a normal ejection fraction, and his EKG today is normal.  He is on a low dose of metoprolol, which he believes helps with the palpitations. He also takes Crestor (rosuvastatin) 5 mg daily, started after the 2017 coronary CTA findings. His LDL was 70 in January, and his cholesterol levels are generally around 150.  He follows a Mediterranean diet and exercises regularly, engaging in aerobic activities three to four times a week and weight training twice a week. His wife is a vegetarian, and he often has meatless meals. He monitors his blood pressure at home using a remote monitoring system, with recent readings around 116/68.  There is no family history of heart problems. His parents lived into their 63s, and his grandparents lived into their 27s, with one reaching 73. His grandfathers were heavy smokers but still lived into their 29s.     Studies Reviewed:  Marland Kitchen   EKG Interpretation Date/Time:  Wednesday August 08 2023 10:33:47 EDT Ventricular Rate:  89 PR Interval:  156 QRS Duration:  72 QT Interval:  364 QTC Calculation: 442 R Axis:   51  Text Interpretation: Normal sinus rhythm Normal ECG No previous ECGs available Confirmed by Donato Schultz ([id number]) on 08/08/2023 10:38:57 AM    Results LABS LDL: 70 (05/2023) Hb: 16.5 (05/2023)  RADIOLOGY Coronary CTA: Calcium score 52, mild nonobstructive disease (2017)  DIAGNOSTIC REPORTS Echocardiography: Ejection fraction normal (08/08/2023) EKG: Normal (08/08/2023) Risk Assessment/Calculations:           Physical Exam:   VS:  BP (!) 156/72   Pulse 89   Ht 5\' 9"  (1.753 m)   Wt 167 lb 9.6 oz (76 kg)   SpO2 99%   BMI 24.75 kg/m    Wt Readings from Last 3 Encounters:  08/08/23 167 lb 9.6 oz (76 kg)  07/14/22 168 lb 3.2 oz (76.3 kg)  07/08/21 169 lb 3.2 oz (76.7 kg)    GEN: Well nourished, well developed in no acute distress NECK: No JVD; No carotid bruits CARDIAC: RRR, no murmurs, no rubs, no gallops RESPIRATORY:  Clear to auscultation without rales, wheezing or rhonchi  ABDOMEN: Soft, non-tender, non-distended EXTREMITIES:  No edema; No deformity   ASSESSMENT AND PLAN: .    Assessment and Plan Assessment & Plan Coronary Artery Disease Mild nonobstructive coronary artery disease with a calcium score of 52 on coronary CTA in 2017. Ejection fraction is normal. He is on rosuvastatin 5 mg, which is well-tolerated.  LDL was 70 in January, and cholesterol is around 150. He follows a Mediterranean diet and exercises regularly. No family history of heart problems. Cardiologist suggests graduating from cardiology clinic due to effective management by primary care physician. - Continue rosuvastatin 5 mg daily - Maintain Mediterranean diet and regular exercise - Follow up with primary care physician for routine management - Return to cardiology if new symptoms arise or if primary care physician  advises  Palpitations Intermittent palpitations, possibly related to stress, caffeine, or lack of sleep. Previously evaluated as PVCs or PACs, not atrial fibrillation. Managed with low-dose metoprolol, which is well-tolerated. Palpitations resolve by the next day. - Continue low-dose metoprolol - Contact cardiology if palpitations become more frequent or severe  Hypertension Blood pressure is well-controlled with recent home readings around 116/68. He participates in a remote monitoring program with primary care physician. - Continue current blood pressure management - Participate in remote monitoring program  Dermatological Concerns Recent dermatological evaluation with removal of lesions and biopsies performed. He undergoes annual dermatological evaluations. - Continue annual dermatological evaluations           Signed, Donato Schultz, MD

## 2023-08-08 NOTE — Patient Instructions (Signed)
 Medication Instructions:  Your physician recommends that you continue on your current medications as directed. Please refer to the Current Medication list given to you today.  *If you need a refill on your cardiac medications before your next appointment, please call your pharmacy*  Lab Work: NONE If you have labs (blood work) drawn today and your tests are completely normal, you will receive your results only by: MyChart Message (if you have MyChart) OR A paper copy in the mail If you have any lab test that is abnormal or we need to change your treatment, we will call you to review the results.  Testing/Procedures: NONE  Follow-Up: At [facility], you and your health needs are our priority.  As part of our continuing mission to provide you with exceptional heart care, our providers are all part of one team.  This team includes your primary Cardiologist (physician) and Advanced Practice Providers or APPs (Physician Assistants and Nurse Practitioners) who all work together to provide you with the care you need, when you need it.  Your next appointment:   AS NEEDED  We recommend signing up for the patient portal called "MyChart".  Sign up information is provided on this After Visit Summary.  MyChart is used to connect with patients for Virtual Visits (Telemedicine).  Patients are able to view lab/test results, encounter notes, upcoming appointments, etc.  Non-urgent messages can be sent to your provider as well.   To learn more about what you can do with MyChart, go to [email or web address].   Other Instructions       1st Floor: - Lobby - Registration  - Pharmacy  - Lab - Cafe  2nd Floor: - PV Lab - Diagnostic Testing (echo, CT, nuclear med)  3rd Floor: - Vacant  4th Floor: - TCTS (cardiothoracic surgery) - AFib Clinic - Structural Heart Clinic - Vascular Surgery  - Vascular Ultrasound  5th Floor: - HeartCare Cardiology (general and EP) - Clinical Pharmacy  for coumadin, hypertension, lipid, weight-loss medications, and med management appointments    Valet parking services will be available as well.

## 2023-08-29 DIAGNOSIS — I2510 Atherosclerotic heart disease of native coronary artery without angina pectoris: Secondary | ICD-10-CM | POA: Diagnosis not present

## 2023-08-29 DIAGNOSIS — I10 Essential (primary) hypertension: Secondary | ICD-10-CM | POA: Diagnosis not present

## 2023-09-05 DIAGNOSIS — I10 Essential (primary) hypertension: Secondary | ICD-10-CM | POA: Diagnosis not present

## 2023-09-05 DIAGNOSIS — E7800 Pure hypercholesterolemia, unspecified: Secondary | ICD-10-CM | POA: Diagnosis not present

## 2023-09-05 DIAGNOSIS — I2510 Atherosclerotic heart disease of native coronary artery without angina pectoris: Secondary | ICD-10-CM | POA: Diagnosis not present

## 2023-09-28 DIAGNOSIS — I2510 Atherosclerotic heart disease of native coronary artery without angina pectoris: Secondary | ICD-10-CM | POA: Diagnosis not present

## 2023-09-28 DIAGNOSIS — I10 Essential (primary) hypertension: Secondary | ICD-10-CM | POA: Diagnosis not present

## 2023-10-06 DIAGNOSIS — I2510 Atherosclerotic heart disease of native coronary artery without angina pectoris: Secondary | ICD-10-CM | POA: Diagnosis not present

## 2023-10-06 DIAGNOSIS — E7800 Pure hypercholesterolemia, unspecified: Secondary | ICD-10-CM | POA: Diagnosis not present

## 2023-10-06 DIAGNOSIS — I10 Essential (primary) hypertension: Secondary | ICD-10-CM | POA: Diagnosis not present

## 2023-10-13 DIAGNOSIS — S80261A Insect bite (nonvenomous), right knee, initial encounter: Secondary | ICD-10-CM | POA: Diagnosis not present

## 2023-10-13 DIAGNOSIS — W57XXXA Bitten or stung by nonvenomous insect and other nonvenomous arthropods, initial encounter: Secondary | ICD-10-CM | POA: Diagnosis not present

## 2023-10-17 ENCOUNTER — Encounter (INDEPENDENT_AMBULATORY_CARE_PROVIDER_SITE_OTHER): Admitting: Ophthalmology

## 2023-10-17 DIAGNOSIS — H43813 Vitreous degeneration, bilateral: Secondary | ICD-10-CM | POA: Diagnosis not present

## 2023-10-17 DIAGNOSIS — H34812 Central retinal vein occlusion, left eye, with macular edema: Secondary | ICD-10-CM

## 2023-10-28 DIAGNOSIS — I10 Essential (primary) hypertension: Secondary | ICD-10-CM | POA: Diagnosis not present

## 2023-10-28 DIAGNOSIS — I2510 Atherosclerotic heart disease of native coronary artery without angina pectoris: Secondary | ICD-10-CM | POA: Diagnosis not present

## 2023-10-30 DIAGNOSIS — L820 Inflamed seborrheic keratosis: Secondary | ICD-10-CM | POA: Diagnosis not present

## 2023-10-30 DIAGNOSIS — D485 Neoplasm of uncertain behavior of skin: Secondary | ICD-10-CM | POA: Diagnosis not present

## 2023-10-30 DIAGNOSIS — Z85828 Personal history of other malignant neoplasm of skin: Secondary | ICD-10-CM | POA: Diagnosis not present

## 2023-11-05 DIAGNOSIS — E7800 Pure hypercholesterolemia, unspecified: Secondary | ICD-10-CM | POA: Diagnosis not present

## 2023-11-05 DIAGNOSIS — I2510 Atherosclerotic heart disease of native coronary artery without angina pectoris: Secondary | ICD-10-CM | POA: Diagnosis not present

## 2023-11-05 DIAGNOSIS — I10 Essential (primary) hypertension: Secondary | ICD-10-CM | POA: Diagnosis not present

## 2023-11-12 ENCOUNTER — Other Ambulatory Visit: Payer: Self-pay

## 2023-11-12 DIAGNOSIS — E785 Hyperlipidemia, unspecified: Secondary | ICD-10-CM

## 2023-11-12 MED ORDER — ROSUVASTATIN CALCIUM 5 MG PO TABS: ORAL_TABLET | ORAL | 2 refills | Status: AC

## 2023-11-27 DIAGNOSIS — I2510 Atherosclerotic heart disease of native coronary artery without angina pectoris: Secondary | ICD-10-CM | POA: Diagnosis not present

## 2023-11-27 DIAGNOSIS — L820 Inflamed seborrheic keratosis: Secondary | ICD-10-CM | POA: Diagnosis not present

## 2023-11-27 DIAGNOSIS — L821 Other seborrheic keratosis: Secondary | ICD-10-CM | POA: Diagnosis not present

## 2023-11-27 DIAGNOSIS — Z85828 Personal history of other malignant neoplasm of skin: Secondary | ICD-10-CM | POA: Diagnosis not present

## 2023-11-27 DIAGNOSIS — I10 Essential (primary) hypertension: Secondary | ICD-10-CM | POA: Diagnosis not present

## 2023-12-06 DIAGNOSIS — E7800 Pure hypercholesterolemia, unspecified: Secondary | ICD-10-CM | POA: Diagnosis not present

## 2023-12-06 DIAGNOSIS — I10 Essential (primary) hypertension: Secondary | ICD-10-CM | POA: Diagnosis not present

## 2023-12-06 DIAGNOSIS — I2510 Atherosclerotic heart disease of native coronary artery without angina pectoris: Secondary | ICD-10-CM | POA: Diagnosis not present

## 2024-01-02 ENCOUNTER — Encounter (INDEPENDENT_AMBULATORY_CARE_PROVIDER_SITE_OTHER): Admitting: Ophthalmology

## 2024-01-02 DIAGNOSIS — H43813 Vitreous degeneration, bilateral: Secondary | ICD-10-CM

## 2024-01-02 DIAGNOSIS — I10 Essential (primary) hypertension: Secondary | ICD-10-CM

## 2024-01-02 DIAGNOSIS — H35033 Hypertensive retinopathy, bilateral: Secondary | ICD-10-CM | POA: Diagnosis not present

## 2024-01-02 DIAGNOSIS — H353111 Nonexudative age-related macular degeneration, right eye, early dry stage: Secondary | ICD-10-CM | POA: Diagnosis not present

## 2024-01-02 DIAGNOSIS — H34812 Central retinal vein occlusion, left eye, with macular edema: Secondary | ICD-10-CM

## 2024-01-02 DIAGNOSIS — H2513 Age-related nuclear cataract, bilateral: Secondary | ICD-10-CM

## 2024-01-30 DIAGNOSIS — L570 Actinic keratosis: Secondary | ICD-10-CM | POA: Diagnosis not present

## 2024-01-30 DIAGNOSIS — C4441 Basal cell carcinoma of skin of scalp and neck: Secondary | ICD-10-CM | POA: Diagnosis not present

## 2024-01-30 DIAGNOSIS — D225 Melanocytic nevi of trunk: Secondary | ICD-10-CM | POA: Diagnosis not present

## 2024-01-30 DIAGNOSIS — D485 Neoplasm of uncertain behavior of skin: Secondary | ICD-10-CM | POA: Diagnosis not present

## 2024-01-30 DIAGNOSIS — L905 Scar conditions and fibrosis of skin: Secondary | ICD-10-CM | POA: Diagnosis not present

## 2024-01-30 DIAGNOSIS — L820 Inflamed seborrheic keratosis: Secondary | ICD-10-CM | POA: Diagnosis not present

## 2024-01-30 DIAGNOSIS — L821 Other seborrheic keratosis: Secondary | ICD-10-CM | POA: Diagnosis not present

## 2024-01-30 DIAGNOSIS — Z85828 Personal history of other malignant neoplasm of skin: Secondary | ICD-10-CM | POA: Diagnosis not present

## 2024-01-30 DIAGNOSIS — D045 Carcinoma in situ of skin of trunk: Secondary | ICD-10-CM | POA: Diagnosis not present

## 2024-02-12 DIAGNOSIS — Z85828 Personal history of other malignant neoplasm of skin: Secondary | ICD-10-CM | POA: Diagnosis not present

## 2024-02-12 DIAGNOSIS — D485 Neoplasm of uncertain behavior of skin: Secondary | ICD-10-CM | POA: Diagnosis not present

## 2024-02-12 DIAGNOSIS — L988 Other specified disorders of the skin and subcutaneous tissue: Secondary | ICD-10-CM | POA: Diagnosis not present

## 2024-02-28 DIAGNOSIS — Z23 Encounter for immunization: Secondary | ICD-10-CM | POA: Diagnosis not present

## 2024-03-19 ENCOUNTER — Encounter (INDEPENDENT_AMBULATORY_CARE_PROVIDER_SITE_OTHER): Admitting: Ophthalmology

## 2024-03-19 DIAGNOSIS — I10 Essential (primary) hypertension: Secondary | ICD-10-CM

## 2024-03-19 DIAGNOSIS — H43813 Vitreous degeneration, bilateral: Secondary | ICD-10-CM

## 2024-03-19 DIAGNOSIS — H35033 Hypertensive retinopathy, bilateral: Secondary | ICD-10-CM

## 2024-03-19 DIAGNOSIS — H353112 Nonexudative age-related macular degeneration, right eye, intermediate dry stage: Secondary | ICD-10-CM

## 2024-03-19 DIAGNOSIS — H34812 Central retinal vein occlusion, left eye, with macular edema: Secondary | ICD-10-CM

## 2024-06-04 ENCOUNTER — Encounter (INDEPENDENT_AMBULATORY_CARE_PROVIDER_SITE_OTHER): Admitting: Ophthalmology

## 2024-06-04 DIAGNOSIS — H35033 Hypertensive retinopathy, bilateral: Secondary | ICD-10-CM

## 2024-06-04 DIAGNOSIS — I10 Essential (primary) hypertension: Secondary | ICD-10-CM

## 2024-06-04 DIAGNOSIS — H34812 Central retinal vein occlusion, left eye, with macular edema: Secondary | ICD-10-CM | POA: Diagnosis not present

## 2024-06-04 DIAGNOSIS — H43813 Vitreous degeneration, bilateral: Secondary | ICD-10-CM | POA: Diagnosis not present

## 2024-08-27 ENCOUNTER — Encounter (INDEPENDENT_AMBULATORY_CARE_PROVIDER_SITE_OTHER): Admitting: Ophthalmology
